# Patient Record
Sex: Female | Born: 1960
Health system: Southern US, Community
[De-identification: ages and names within clinical notes are randomized; demographics above are authoritative.]

## PROBLEM LIST (undated history)

## (undated) DIAGNOSIS — E785 Hyperlipidemia, unspecified: Secondary | ICD-10-CM

## (undated) DIAGNOSIS — R945 Abnormal results of liver function studies: Principal | ICD-10-CM

## (undated) DIAGNOSIS — I10 Essential (primary) hypertension: Secondary | ICD-10-CM

## (undated) DIAGNOSIS — M1990 Unspecified osteoarthritis, unspecified site: Secondary | ICD-10-CM

## (undated) DIAGNOSIS — E079 Disorder of thyroid, unspecified: Secondary | ICD-10-CM

## (undated) DIAGNOSIS — F419 Anxiety disorder, unspecified: Secondary | ICD-10-CM

## (undated) DIAGNOSIS — E039 Hypothyroidism, unspecified: Secondary | ICD-10-CM

## (undated) DIAGNOSIS — R7989 Other specified abnormal findings of blood chemistry: Secondary | ICD-10-CM

## (undated) HISTORY — DX: Anxiety disorder, unspecified: F41.9

## (undated) HISTORY — DX: Abnormal results of liver function studies: R94.5

## (undated) HISTORY — DX: Hypothyroidism, unspecified: E03.9

## (undated) HISTORY — DX: Hyperlipidemia, unspecified: E78.5

## (undated) HISTORY — DX: Disorder of thyroid, unspecified: E07.9

## (undated) HISTORY — DX: Other specified abnormal findings of blood chemistry: R79.89

## (undated) HISTORY — DX: Unspecified osteoarthritis, unspecified site: M19.90

## (undated) HISTORY — DX: Essential (primary) hypertension: I10

---
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
## 1979-03-22 HISTORY — PX: FUNCTIONAL ENDOSCOPIC SINUS SURGERY: SUR616

## 1999-01-26 ENCOUNTER — Encounter: Admission: RE | Admit: 1999-01-26 | Discharge: 1999-01-26 | Payer: Self-pay | Admitting: Obstetrics and Gynecology

## 1999-01-26 ENCOUNTER — Encounter: Payer: Self-pay | Admitting: Obstetrics and Gynecology

## 2009-07-28 LAB — HM MAMMOGRAPHY: HM Mammogram: NORMAL

## 2009-08-25 LAB — HM PAP SMEAR: HM Pap smear: NORMAL

## 2010-09-23 ENCOUNTER — Encounter: Payer: Self-pay | Admitting: Family Medicine

## 2010-09-23 DIAGNOSIS — E039 Hypothyroidism, unspecified: Secondary | ICD-10-CM | POA: Insufficient documentation

## 2011-08-27 ENCOUNTER — Other Ambulatory Visit: Payer: Self-pay

## 2011-08-27 ENCOUNTER — Encounter (HOSPITAL_COMMUNITY): Payer: Self-pay | Admitting: *Deleted

## 2011-08-27 ENCOUNTER — Emergency Department (HOSPITAL_COMMUNITY): Payer: Commercial Managed Care - PPO

## 2011-08-27 ENCOUNTER — Emergency Department (HOSPITAL_COMMUNITY)
Admission: EM | Admit: 2011-08-27 | Discharge: 2011-08-27 | Disposition: A | Discharge status: Home / Self Care | Payer: Commercial Managed Care - PPO | Attending: Emergency Medicine | Admitting: Emergency Medicine

## 2011-08-27 DIAGNOSIS — M549 Dorsalgia, unspecified: Secondary | ICD-10-CM | POA: Insufficient documentation

## 2011-08-27 DIAGNOSIS — E039 Hypothyroidism, unspecified: Secondary | ICD-10-CM | POA: Insufficient documentation

## 2011-08-27 DIAGNOSIS — R110 Nausea: Secondary | ICD-10-CM | POA: Insufficient documentation

## 2011-08-27 DIAGNOSIS — R079 Chest pain, unspecified: Secondary | ICD-10-CM | POA: Insufficient documentation

## 2011-08-27 DIAGNOSIS — R51 Headache: Secondary | ICD-10-CM | POA: Insufficient documentation

## 2011-08-27 LAB — CBC
HCT: 41.2 % (ref 36.0–46.0)
MCH: 29.4 pg (ref 26.0–34.0)
MCHC: 34 g/dL (ref 30.0–36.0)
MCV: 86.6 fL (ref 78.0–100.0)
RDW: 12.5 % (ref 11.5–15.5)
WBC: 5.9 10*3/uL (ref 4.0–10.5)

## 2011-08-27 LAB — DIFFERENTIAL
Basophils Absolute: 0.1 10*3/uL (ref 0.0–0.1)
Eosinophils Absolute: 0.4 10*3/uL (ref 0.0–0.7)
Eosinophils Relative: 7 % — ABNORMAL HIGH (ref 0–5)
Lymphocytes Relative: 29 % (ref 12–46)
Monocytes Absolute: 0.4 10*3/uL (ref 0.1–1.0)

## 2011-08-27 LAB — BASIC METABOLIC PANEL
CO2: 25 mEq/L (ref 19–32)
Calcium: 9.6 mg/dL (ref 8.4–10.5)
Creatinine, Ser: 0.74 mg/dL (ref 0.50–1.10)
GFR calc non Af Amer: >90 mL/min (ref >90)
Glucose, Bld: 96 mg/dL (ref 70–99)

## 2011-08-27 LAB — TROPONIN I: Troponin I: <0.3 ng/mL (ref <0.30)

## 2011-08-27 MED ORDER — KETOROLAC TROMETHAMINE 30 MG/ML IJ SOLN
30.0000 mg | Freq: Once | INTRAMUSCULAR | Status: AC
  Administered 2011-08-27: 30 mg via INTRAVENOUS
  Filled 2011-08-27: qty 1

## 2011-08-27 MED ORDER — SODIUM CHLORIDE 0.9 % IV BOLUS (SEPSIS)
1000.0000 mL | Freq: Once | INTRAVENOUS | Status: AC
  Administered 2011-08-27: 1000 mL via INTRAVENOUS

## 2011-08-27 MED ORDER — ONDANSETRON HCL 4 MG/2ML IJ SOLN
4.0000 mg | Freq: Once | INTRAMUSCULAR | Status: AC
  Administered 2011-08-27: 4 mg via INTRAVENOUS
  Filled 2011-08-27: qty 2

## 2011-08-27 NOTE — ED Provider Notes (Signed)
History     CSN: [id number]  Arrival date & time 08/27/11  1003   First MD Initiated Contact with Patient 08/27/11 1044      Chief Complaint  Patient presents with  . Chest Pain    (Consider location/radiation/quality/duration/timing/severity/associated sxs/prior treatment) HPI Comments: Patient presents complaining of chest tightness over the last 3-4 days.  She states that it goes down both arms and across her upper back.  She also notes a mild headache and mild nausea.  No vomiting.  No fevers or cough.  No significant shortness of breath.  No abdominal pain.  No new leg swelling or leg pain.  Patient does not smoke.  She does note a family history of coronary disease in her mother and father which makes her concerned for coronary disease.  No specific inciting or relieving factors.  Patient notes that she feels slightly more tired the last few days.  Patient is a 51 y.o. female presenting with chest pain. The history is provided by the patient. No language interpreter was used.  Chest Pain The chest pain began 3 - 5 days ago. Primary symptoms include nausea. Pertinent negatives for primary symptoms include no fever, no shortness of breath, no cough, no abdominal pain and no vomiting.     Past Medical History  Diagnosis Date  . Hypothyroidism   . Hypothyroidism     History reviewed. No pertinent past surgical history.  History reviewed. No pertinent family history.  History  Substance Use Topics  . Smoking status: Not on file  . Smokeless tobacco: Not on file  . Alcohol Use: 1.8 oz/week    1 Glasses of wine, 1 Cans of beer, 1 Shots of liquor per week    OB History    Grav Para Term Preterm Abortions TAB SAB Ect Mult Living                  Review of Systems  Constitutional: Negative.  Negative for fever and chills.  Eyes: Negative.  Negative for discharge and redness.  Respiratory: Positive for chest tightness. Negative for cough and shortness of breath.     Cardiovascular: Positive for chest pain.  Gastrointestinal: Positive for nausea. Negative for vomiting, abdominal pain and diarrhea.  Genitourinary: Negative.   Musculoskeletal: Positive for back pain.  Skin: Negative.  Negative for color change and rash.  Neurological: Positive for headaches. Negative for syncope.  Hematological: Negative.  Negative for adenopathy.  Psychiatric/Behavioral: Negative.  Negative for confusion.  All other systems reviewed and are negative.    Allergies  Betadine; Demerol; Fish allergy; and Shrimp  Home Medications   Current Outpatient Rx  Name Route Sig Dispense Refill  . ASPIRIN 81 MG PO CHEW Oral Chew 81 mg by mouth daily.    Marland Kitchen LEVOTHYROXINE SODIUM 88 MCG PO TABS Oral Take 88 mcg by mouth daily before breakfast.     . OVER THE COUNTER MEDICATION Sublingual Place 1 tablet under the tongue daily. Vitamin b 12 sublingual    . PHENTERMINE HCL 15 MG PO CAPS Oral Take 15 mg by mouth every morning.      BP 126/83  Pulse 63  Temp(Src) 98.3 F (36.8 C) (Oral)  Resp 14  SpO2 100%  Physical Exam  Nursing note and vitals reviewed. Constitutional: She is oriented to person, place, and time. She appears well-developed and well-nourished.  Non-toxic appearance. She does not have a sickly appearance.  HENT:  Head: Normocephalic and atraumatic.  Eyes: Conjunctivae, EOM and lids  are normal. Pupils are equal, round, and reactive to light. No scleral icterus.  Neck: Trachea normal and normal range of motion. Neck supple.  Cardiovascular: Normal rate, regular rhythm and normal heart sounds.   Pulmonary/Chest: Effort normal and breath sounds normal. No respiratory distress. She has no wheezes. She has no rales. She exhibits no tenderness.  Abdominal: Soft. Normal appearance. There is no tenderness. There is no rebound, no guarding and no CVA tenderness.  Musculoskeletal: Normal range of motion. She exhibits no edema.  Neurological: She is alert and oriented to  person, place, and time. She has normal strength.  Skin: Skin is warm, dry and intact. No rash noted.  Psychiatric: She has a normal mood and affect. Her behavior is normal. Judgment and thought content normal.    ED Course  Procedures (including critical care time)  Results for orders placed during the hospital encounter of 08/27/11  CBC      Component Value Range   WBC 5.9  4.0 - 10.5 (K/uL)   RBC 4.76  3.87 - 5.11 (MIL/uL)   Hemoglobin 14.0  12.0 - 15.0 (g/dL)   HCT 16.1  09.6 - 04.5 (%)   MCV 86.6  78.0 - 100.0 (fL)   MCH 29.4  26.0 - 34.0 (pg)   MCHC 34.0  30.0 - 36.0 (g/dL)   RDW 40.9  81.1 - 91.4 (%)   Platelets 270  150 - 400 (K/uL)  DIFFERENTIAL      Component Value Range   Neutrophils Relative 56  43 - 77 (%)   Neutro Abs 3.3  1.7 - 7.7 (K/uL)   Lymphocytes Relative 29  12 - 46 (%)   Lymphs Abs 1.7  0.7 - 4.0 (K/uL)   Monocytes Relative 7  3 - 12 (%)   Monocytes Absolute 0.4  0.1 - 1.0 (K/uL)   Eosinophils Relative 7 (*) 0 - 5 (%)   Eosinophils Absolute 0.4  0.0 - 0.7 (K/uL)   Basophils Relative 1  0 - 1 (%)   Basophils Absolute 0.1  0.0 - 0.1 (K/uL)  BASIC METABOLIC PANEL      Component Value Range   Sodium 137  135 - 145 (mEq/L)   Potassium 4.1  3.5 - 5.1 (mEq/L)   Chloride 102  96 - 112 (mEq/L)   CO2 25  19 - 32 (mEq/L)   Glucose, Bld 96  70 - 99 (mg/dL)   BUN 14  6 - 23 (mg/dL)   Creatinine, Ser 7.82  0.50 - 1.10 (mg/dL)   Calcium 9.6  8.4 - 95.6 (mg/dL)   GFR calc non Af Amer >90  >90 (mL/min)   GFR calc Af Amer >90  >90 (mL/min)  TROPONIN I      Component Value Range   Troponin I <0.30  <0.30 (ng/mL)   Dg Chest 2 View  08/27/2011  *RADIOLOGY REPORT*  Clinical Data: Chest pain.  CHEST - 2 VIEW  Comparison: None.  Findings: Two views of the chest demonstrate clear lungs. Heart and mediastinum are within normal limits.  The trachea is midline. Bony structures are intact.  IMPRESSION: No acute chest findings.  Original Report Authenticated By: Richarda Overlie,  M.D.      Date: 08/27/2011  Rate: 77  Rhythm: normal sinus rhythm  QRS Axis: normal  Intervals: normal  ST/T Wave abnormalities: normal  Conduction Disutrbances:none  Narrative Interpretation:   Old EKG Reviewed: none available    MDM  Patient with vague chest pain, nausea, shoulder pain  and headache over the last few days.  She has no acute symptoms for infection at this time.  This does not appear to be specifically exertional to suggest that this is coronary disease.  She has a TIMI 0 as her only risk factor is family history.  Patient has a normal EKG and as the symptoms have been going on for the last 3-4 days I believe 1 troponin is appropriate and it was negative.  Patient's chest x-ray is normal.  Her symptoms do not fit for PE as she does not have significant shortness of breath nor does she have tachycardia or any hypoxia or other risk factors for PE.  While the etiology of her symptoms are unclear I feel that she is safe for discharge home with followup with her primary care physician as an outpatient.        Nat Christen, MD 08/27/11 [phone]

## 2011-08-27 NOTE — ED Notes (Signed)
Pt. Stated, I started having some CP that started on Thursday with both arms hurting and going to my back.  Husband stated, her hot flashes have inreased.

## 2011-08-27 NOTE — Discharge Instructions (Signed)
Headache, General, Unknown Cause The specific cause of your headache may not have been found today. There are many causes and types of headache. A few common ones are:  Tension headache.   Migraine.   Infections (examples: dental and sinus infections).   Bone and/or joint problems in the neck or jaw.   Depression.   Eye problems.  These headaches are not life threatening.  Headaches can sometimes be diagnosed by a patient history and a physical exam. Sometimes, lab and imaging studies (such as x-ray and/or CT scan) are used to rule out more serious problems. In some cases, a spinal tap (lumbar puncture) may be requested. There are many times when your exam and tests may be normal on the first visit even when there is a serious problem causing your headaches. Because of that, it is very important to follow up with your doctor or local clinic for further evaluation. FINDING OUT THE RESULTS OF TESTS  If a radiology test was performed, a radiologist will review your results.   You will be contacted by the emergency department or your physician if any test results require a change in your treatment plan.   Not all test results may be available during your visit. If your test results are not back during the visit, make an appointment with your caregiver to find out the results. Do not assume everything is normal if you have not heard from your caregiver or the medical facility. It is important for you to follow up on all of your test results.  HOME CARE INSTRUCTIONS   Keep follow-up appointments with your caregiver, or any specialist referral.   Only take over-the-counter or prescription medicines for pain, discomfort, or fever as directed by your caregiver.   Biofeedback, massage, or other relaxation techniques may be helpful.   Ice packs or heat applied to the head and neck can be used. Do this three to four times per day, or as needed.   Call your doctor if you have any questions or  concerns.   If you smoke, you should quit.  SEEK MEDICAL CARE IF:   You develop problems with medications prescribed.   You do not respond to or obtain relief from medications.   You have a change from the usual headache.   You develop nausea or vomiting.  SEEK IMMEDIATE MEDICAL CARE IF:   If your headache becomes severe.   You have an unexplained oral temperature above 102 F (38.9 C), or as your caregiver suggests.   You have a stiff neck.   You have loss of vision.   You have muscular weakness.   You have loss of muscular control.   You develop severe symptoms different from your first symptoms.   You start losing your balance or have trouble walking.   You feel faint or pass out.  MAKE SURE YOU:   Understand these instructions.   Will watch your condition.   Will get help right away if you are not doing well or get worse.  Document Released: 03/07/2005 Document Revised: 02/24/2011 Document Reviewed: 10/25/2007 [facility] Patient Information 2012 [place], [place].Chest Pain (Nonspecific) It is often hard to give a specific diagnosis for the cause of chest pain. There is always a chance that your pain could be related to something serious, such as a heart attack or a blood clot in the lungs. You need to follow up with your caregiver for further evaluation. CAUSES   Heartburn.   Pneumonia or bronchitis.  Anxiety or stress.   Inflammation around your heart (pericarditis) or lung (pleuritis or pleurisy).   A blood clot in the lung.   A collapsed lung (pneumothorax). It can develop suddenly on its own (spontaneous pneumothorax) or from injury (trauma) to the chest.   Shingles infection (herpes zoster virus).  The chest wall is composed of bones, muscles, and cartilage. Any of these can be the source of the pain.  The bones can be bruised by injury.   The muscles or cartilage can be strained by coughing or overwork.   The cartilage can be affected by  inflammation and become sore (costochondritis).  DIAGNOSIS  Lab tests or other studies, such as X-rays, electrocardiography, stress testing, or cardiac imaging, may be needed to find the cause of your pain.  TREATMENT   Treatment depends on what may be causing your chest pain. Treatment may include:   Acid blockers for heartburn.   Anti-inflammatory medicine.   Pain medicine for inflammatory conditions.   Antibiotics if an infection is present.   You may be advised to change lifestyle habits. This includes stopping smoking and avoiding alcohol, caffeine, and chocolate.   You may be advised to keep your head raised (elevated) when sleeping. This reduces the chance of acid going backward from your stomach into your esophagus.   Most of the time, nonspecific chest pain will improve within 2 to 3 days with rest and mild pain medicine.  HOME CARE INSTRUCTIONS   If antibiotics were prescribed, take your antibiotics as directed. Finish them even if you start to feel better.   For the next few days, avoid physical activities that bring on chest pain. Continue physical activities as directed.   Do not smoke.   Avoid drinking alcohol.   Only take over-the-counter or prescription medicine for pain, discomfort, or fever as directed by your caregiver.   Follow your caregiver's suggestions for further testing if your chest pain does not go away.   Keep any follow-up appointments you made. If you do not go to an appointment, you could develop lasting (chronic) problems with pain. If there is any problem keeping an appointment, you must call to reschedule.  SEEK MEDICAL CARE IF:   You think you are having problems from the medicine you are taking. Read your medicine instructions carefully.   Your chest pain does not go away, even after treatment.   You develop a rash with blisters on your chest.  SEEK IMMEDIATE MEDICAL CARE IF:   You have increased chest pain or pain that spreads to your  arm, neck, jaw, back, or abdomen.   You develop shortness of breath, an increasing cough, or you are coughing up blood.   You have severe back or abdominal pain, feel nauseous, or vomit.   You develop severe weakness, fainting, or chills.   You have a fever.  THIS IS AN EMERGENCY. Do not wait to see if the pain will go away. Get medical help at once. Call your local emergency services (911 in [place] Do not drive yourself to the hospital. MAKE SURE YOU:   Understand these instructions.   Will watch your condition.   Will get help right away if you are not doing well or get worse.  Document Released: 12/15/2004 Document Revised: 02/24/2011 Document Reviewed: 10/11/2007 [facility] Patient Information 2012 [place], [place].

## 2012-06-06 ENCOUNTER — Telehealth: Payer: Self-pay | Admitting: Family Medicine

## 2012-06-06 MED ORDER — ALBENDAZOLE 200 MG PO TABS: 400.0000 mg | ORAL_TABLET | Freq: Once | ORAL | Status: DC

## 2012-06-06 NOTE — Telephone Encounter (Signed)
Pt called stating no better in a week after taking Albenza 400 mg x 1. Per [facility] use Balmex for itching qhs x 2 weeks and redose Albenza 400mg  x 1 dose if no better in 2 weeks NTBS. Med called in to [facility]. Patient aware.

## 2013-02-19 ENCOUNTER — Encounter: Payer: Self-pay | Admitting: Family Medicine

## 2013-02-25 ENCOUNTER — Encounter: Payer: Self-pay | Admitting: Family Medicine

## 2013-07-23 ENCOUNTER — Other Ambulatory Visit: Payer: Self-pay | Admitting: Family Medicine

## 2013-07-23 MED ORDER — LEVOTHYROXINE SODIUM 88 MCG PO TABS: 88.0000 ug | ORAL_TABLET | Freq: Every day | ORAL | Status: DC

## 2013-07-23 NOTE — Telephone Encounter (Signed)
Rx Refilled - pt has appt 07/26/13 and must keep to get further refills on medication

## 2013-07-24 ENCOUNTER — Other Ambulatory Visit: Payer: Self-pay | Admitting: Physician Assistant

## 2013-07-24 ENCOUNTER — Other Ambulatory Visit: Payer: BC Managed Care – PPO

## 2013-07-24 DIAGNOSIS — E669 Obesity, unspecified: Secondary | ICD-10-CM

## 2013-07-24 DIAGNOSIS — Z79899 Other long term (current) drug therapy: Secondary | ICD-10-CM

## 2013-07-24 DIAGNOSIS — E039 Hypothyroidism, unspecified: Secondary | ICD-10-CM

## 2013-07-24 DIAGNOSIS — E785 Hyperlipidemia, unspecified: Secondary | ICD-10-CM

## 2013-07-24 DIAGNOSIS — Z0000 Encounter for general adult medical examination without abnormal findings: Secondary | ICD-10-CM

## 2013-07-24 LAB — COMPLETE METABOLIC PANEL WITH GFR
ALK PHOS: 81 U/L (ref 39–117)
ALT: 67 U/L — ABNORMAL HIGH (ref 0–35)
AST: 55 U/L — ABNORMAL HIGH (ref 0–37)
Albumin: 4.4 g/dL (ref 3.5–5.2)
BILIRUBIN TOTAL: 0.5 mg/dL (ref 0.2–1.2)
BUN: 15 mg/dL (ref 6–23)
CO2: 27 meq/L (ref 19–32)
CREATININE: 0.86 mg/dL (ref 0.50–1.10)
Calcium: 9.5 mg/dL (ref 8.4–10.5)
Chloride: 105 mEq/L (ref 96–112)
GFR, EST AFRICAN AMERICAN: 89 mL/min
GFR, EST NON AFRICAN AMERICAN: 77 mL/min
GLUCOSE: 97 mg/dL (ref 70–99)
Potassium: 4.8 mEq/L (ref 3.5–5.3)
Sodium: 141 mEq/L (ref 135–145)
TOTAL PROTEIN: 6.5 g/dL (ref 6.0–8.3)

## 2013-07-24 LAB — CBC WITH DIFFERENTIAL/PLATELET
Basophils Absolute: 0.1 10*3/uL (ref 0.0–0.1)
Basophils Relative: 1 % (ref 0–1)
EOS ABS: 0.3 10*3/uL (ref 0.0–0.7)
Eosinophils Relative: 6 % — ABNORMAL HIGH (ref 0–5)
HCT: 40.5 % (ref 36.0–46.0)
HEMOGLOBIN: 13.5 g/dL (ref 12.0–15.0)
LYMPHS ABS: 1.6 10*3/uL (ref 0.7–4.0)
LYMPHS PCT: 31 % (ref 12–46)
MCH: 28.7 pg (ref 26.0–34.0)
MCHC: 33.3 g/dL (ref 30.0–36.0)
MCV: 86.2 fL (ref 78.0–100.0)
MONOS PCT: 7 % (ref 3–12)
Monocytes Absolute: 0.4 10*3/uL (ref 0.1–1.0)
NEUTROS ABS: 2.9 10*3/uL (ref 1.7–7.7)
NEUTROS PCT: 55 % (ref 43–77)
PLATELETS: 264 10*3/uL (ref 150–400)
RBC: 4.7 MIL/uL (ref 3.87–5.11)
RDW: 12.8 % (ref 11.5–15.5)
WBC: 5.2 10*3/uL (ref 4.0–10.5)

## 2013-07-24 LAB — LIPID PANEL
CHOL/HDL RATIO: 3.9 ratio
CHOLESTEROL: 226 mg/dL — AB (ref 0–200)
HDL: 58 mg/dL (ref >39)
LDL Cholesterol: 157 mg/dL — ABNORMAL HIGH (ref 0–99)
TRIGLYCERIDES: 54 mg/dL (ref <150)
VLDL: 11 mg/dL (ref 0–40)

## 2013-07-24 LAB — TSH: TSH: 3.913 u[IU]/mL (ref 0.350–4.500)

## 2013-07-26 ENCOUNTER — Other Ambulatory Visit: Payer: Self-pay | Admitting: Family Medicine

## 2013-07-26 ENCOUNTER — Encounter: Payer: Self-pay | Admitting: Family Medicine

## 2013-07-26 ENCOUNTER — Ambulatory Visit (INDEPENDENT_AMBULATORY_CARE_PROVIDER_SITE_OTHER): Payer: BC Managed Care – PPO | Admitting: Family Medicine

## 2013-07-26 ENCOUNTER — Other Ambulatory Visit: Payer: Self-pay | Admitting: *Deleted

## 2013-07-26 ENCOUNTER — Telehealth: Payer: Self-pay | Admitting: *Deleted

## 2013-07-26 VITALS — BP 122/88 | HR 84 | Temp 97.1°F | Resp 18 | Ht 61.5 in | Wt 151.0 lb

## 2013-07-26 DIAGNOSIS — Z0000 Encounter for general adult medical examination without abnormal findings: Secondary | ICD-10-CM

## 2013-07-26 DIAGNOSIS — E785 Hyperlipidemia, unspecified: Secondary | ICD-10-CM

## 2013-07-26 DIAGNOSIS — G4700 Insomnia, unspecified: Secondary | ICD-10-CM

## 2013-07-26 DIAGNOSIS — Z79899 Other long term (current) drug therapy: Secondary | ICD-10-CM

## 2013-07-26 LAB — HEPATITIS PANEL, ACUTE
HCV AB: NEGATIVE
Hep A IgM: NONREACTIVE
Hep B C IgM: NONREACTIVE
Hepatitis B Surface Ag: NEGATIVE

## 2013-07-26 MED ORDER — ESTROGENS, CONJUGATED 0.625 MG/GM VA CREA: 1.0000 | TOPICAL_CREAM | Freq: Every day | VAGINAL | Status: DC

## 2013-07-26 MED ORDER — TRAZODONE HCL 50 MG PO TABS: 25.0000 mg | ORAL_TABLET | Freq: Every evening | ORAL | Status: DC | PRN

## 2013-07-26 MED ORDER — PRAVASTATIN SODIUM 20 MG PO TABS: 20.0000 mg | ORAL_TABLET | Freq: Every day | ORAL | Status: DC

## 2013-07-26 NOTE — Telephone Encounter (Signed)
Call placed to patient and patient made aware.  

## 2013-07-26 NOTE — Telephone Encounter (Signed)
Lab was added to her other labs and I will send rx to her pharmacy.

## 2013-07-26 NOTE — Telephone Encounter (Signed)
Message copied by Phillips OdorSIX, Ivan Maskell H on Fri Jul 26, 2013  4:22 PM ------      Message from: Malvin JohnsBULLINS, SUSAN S      Created: Fri Jul 26, 2013  2:22 PM       Patient calling asking about her visit and she thought that there should of been some cream at the pharmacy there was none, and she said dr pickard was going to test her for hep c? She wanted to know if we did this [phone] ([phone])       ------

## 2013-07-26 NOTE — Progress Notes (Signed)
Subjective:    Patient ID: Meghan Lozano, female    DOB: 02/09/1961, 53 y.o.   MRN: [id number]  HPI  Patient is here today for complete physical exam.  She is also complaining of insomnia due to stress. She is also reporting fatigue. She has never had a colonoscopy. Her mammogram was performed in November of 2014. She is due for a Pap smear. Her most recent lab work his below: Lab on 07/24/2013  Component Date Value Ref Range Status  . WBC 07/24/2013 5.2  4.0 - 10.5 K/uL Final  . RBC 07/24/2013 4.70  3.87 - 5.11 MIL/uL Final  . Hemoglobin 07/24/2013 13.5  12.0 - 15.0 g/dL Final  . HCT 07/24/2013 40.5  36.0 - 46.0 % Final  . MCV 07/24/2013 86.2  78.0 - 100.0 fL Final  . MCH 07/24/2013 28.7  26.0 - 34.0 pg Final  . MCHC 07/24/2013 33.3  30.0 - 36.0 g/dL Final  . RDW 07/24/2013 12.8  11.5 - 15.5 % Final  . Platelets 07/24/2013 264  150 - 400 K/uL Final  . Neutrophils Relative % 07/24/2013 55  43 - 77 % Final  . Neutro Abs 07/24/2013 2.9  1.7 - 7.7 K/uL Final  . Lymphocytes Relative 07/24/2013 31  12 - 46 % Final  . Lymphs Abs 07/24/2013 1.6  0.7 - 4.0 K/uL Final  . Monocytes Relative 07/24/2013 7  3 - 12 % Final  . Monocytes Absolute 07/24/2013 0.4  0.1 - 1.0 K/uL Final  . Eosinophils Relative 07/24/2013 6* 0 - 5 % Final  . Eosinophils Absolute 07/24/2013 0.3  0.0 - 0.7 K/uL Final  . Basophils Relative 07/24/2013 1  0 - 1 % Final  . Basophils Absolute 07/24/2013 0.1  0.0 - 0.1 K/uL Final  . Smear Review 07/24/2013 Criteria for review not met   Final  . Cholesterol 07/24/2013 226* 0 - 200 mg/dL Final   Comment: ATP III Classification:                                < 200        mg/dL        Desirable                               200 - 239     mg/dL        Borderline High                               >= 240        mg/dL        High                             . Triglycerides 07/24/2013 54  <150 mg/dL Final  . HDL 07/24/2013 58  >39 mg/dL Final  . Total CHOL/HDL Ratio 07/24/2013  3.9   Final  . VLDL 07/24/2013 11  0 - 40 mg/dL Final  . LDL Cholesterol 07/24/2013 157* 0 - 99 mg/dL Final   Comment:                            Total Cholesterol/HDL Ratio:CHD Risk  Coronary Heart Disease Risk Table                                                                 Men       Women                                   1/2 Average Risk              3.4        3.3                                       Average Risk              5.0        4.4                                    2X Average Risk              9.6        7.1                                    3X Average Risk             23.4       11.0                          Use the calculated Patient Ratio above and the CHD Risk table                           to determine the patient's CHD Risk.                          ATP III Classification (LDL):                                < 100        mg/dL         Optimal                               100 - 129     mg/dL         Near or Above Optimal                               130 - 159     mg/dL         Borderline High                               160 - 189     mg/dL           High                                > 190        mg/dL         Very High                             . TSH 07/24/2013 3.913  0.350 - 4.500 uIU/mL Final  . Sodium 07/24/2013 141  135 - 145 mEq/L Final  . Potassium 07/24/2013 4.8  3.5 - 5.3 mEq/L Final  . Chloride 07/24/2013 105  96 - 112 mEq/L Final  . CO2 07/24/2013 27  19 - 32 mEq/L Final  . Glucose, Bld 07/24/2013 97  70 - 99 mg/dL Final  . BUN 07/24/2013 15  6 - 23 mg/dL Final  . Creat 07/24/2013 0.86  0.50 - 1.10 mg/dL Final  . Total Bilirubin 07/24/2013 0.5  0.2 - 1.2 mg/dL Final  . Alkaline Phosphatase 07/24/2013 81  39 - 117 U/L Final  . AST 07/24/2013 55* 0 - 37 U/L Final  . ALT 07/24/2013 67* 0 - 35 U/L Final  . Total Protein 07/24/2013 6.5  6.0 - 8.3 g/dL Final  . Albumin 07/24/2013 4.4  3.5 - 5.2 g/dL  Final  . Calcium 07/24/2013 9.5  8.4 - 10.5 mg/dL Final  . GFR, Est African American 07/24/2013 89   Final  . GFR, Est Non African American 07/24/2013 77   Final   Comment:                            The estimated GFR is a calculation valid for adults (>=62 years old)                          that uses the CKD-EPI algorithm to adjust for age and sex. It is                            not to be used for children, pregnant women, hospitalized patients,                             patients on dialysis, or with rapidly changing kidney function.                          According to the NKDEP, eGFR >89 is normal, 60-89 shows mild                          impairment, 30-59 shows moderate impairment, 15-29 shows severe                          impairment and <15 is ESRD.                              Past Medical History  Diagnosis Date  . Hypothyroidism   . Hypothyroidism    No past surgical history on file. Current Outpatient Prescriptions on File Prior to Visit  Medication Sig Dispense Refill  . levothyroxine (SYNTHROID, LEVOTHROID) 88 MCG tablet Take 1 tablet (88 mcg  total) by mouth daily before breakfast.  30 tablet  0   No current facility-administered medications on file prior to visit.   Allergies  Allergen Reactions  . Betadine [Povidone Iodine] Swelling  . Demerol Nausea And Vomiting  . Fish Allergy Nausea And Vomiting  . Shrimp [Shellfish Allergy] Nausea And Vomiting   History   Social History  . Marital Status: Married    Spouse Name: N/A    Number of Children: N/A  . Years of Education: N/A   Occupational History  . Not on file.   Social History Main Topics  . Smoking status: Never Smoker   . Smokeless tobacco: Never Used  . Alcohol Use: 1.8 oz/week    1 Glasses of wine, 1 Cans of beer, 1 Shots of liquor per week  . Drug Use: No  . Sexual Activity: Not on file     Comment: married to August Saucer,   Other Topics Concern  . Not on file   Social History Narrative  .  No narrative on file   Family History  Problem Relation Age of Onset  . Heart disease Mother   . Heart disease Father   . Heart disease Paternal Uncle      Review of Systems  All other systems reviewed and are negative.      Objective:   Physical Exam  Vitals reviewed. Constitutional: She is oriented to person, place, and time. She appears well-developed and well-nourished. No distress.  HENT:  Head: Normocephalic and atraumatic.  Right Ear: External ear normal.  Left Ear: External ear normal.  Nose: Nose normal.  Mouth/Throat: Oropharynx is clear and moist. No oropharyngeal exudate.  Eyes: Conjunctivae and EOM are normal. Pupils are equal, round, and reactive to light. Right eye exhibits no discharge. Left eye exhibits no discharge. No scleral icterus.  Neck: Normal range of motion. Neck supple. No JVD present. No tracheal deviation present. No thyromegaly present.  Cardiovascular: Normal rate, regular rhythm, normal heart sounds and intact distal pulses.  Exam reveals no gallop and no friction rub.   No murmur heard. Pulmonary/Chest: Effort normal and breath sounds normal. No stridor. No respiratory distress. She has no wheezes. She has no rales. She exhibits no tenderness.  Abdominal: Soft. Bowel sounds are normal. She exhibits no distension and no mass. There is no tenderness. There is no rebound and no guarding.  Genitourinary: Vagina normal and uterus normal.  Musculoskeletal: Normal range of motion. She exhibits no edema and no tenderness.  Lymphadenopathy:    She has no cervical adenopathy.  Neurological: She is alert and oriented to person, place, and time. She has normal reflexes. She displays normal reflexes. No cranial nerve deficit. She exhibits normal muscle tone. Coordination normal.  Skin: Skin is warm. No rash noted. She is not diaphoretic. No erythema. No pallor.  Psychiatric: She has a normal mood and affect. Her behavior is normal. Judgment and thought content  normal.          Assessment & Plan:  1. Routine general medical examination at a health care facility Exam is normal. Her immunizations are up to date. Have recommended diet exercise and weight loss. - PAP, Thin Prep w/HPV rflx HPV Type 16/18  2. Insomnia Begin trazodone 50 mg by mouth each bedtime when necessary insomnia. - traZODone (DESYREL) 50 MG tablet; Take 0.5-1 tablets (25-50 mg total) by mouth at bedtime as needed for sleep.  Dispense: 30 tablet; Refill: 3  3. HLD (hyperlipidemia) Start pravastatin 20 mg by  mouth daily. Recheck fasting lipid panel and CMP in 3 months. This is done mainly because of her strong family history for coronary artery disease. I also recommended aspirin 81 mg by mouth daily - pravastatin (PRAVACHOL) 20 MG tablet; Take 1 tablet (20 mg total) by mouth daily.  Dispense: 90 tablet; Refill: 3

## 2013-07-26 NOTE — Telephone Encounter (Signed)
Returned call to patient.   Reports that since her liver enzymes were elevated, MD had suggested to have Hep C test drawn. Patient is requesting to know if this lab was obtained or if she needs to come back to have it drawn.   Also states that MD suggested Estrogen cream for menopausal symptoms, but no new medications have been sent to pharmacy.   MD please advise.

## 2013-07-29 LAB — PAP, THIN PREP W/HPV RFLX HPV TYPE 16/18: HPV DNA HIGH RISK: NOT DETECTED

## 2013-08-16 ENCOUNTER — Other Ambulatory Visit: Payer: Self-pay | Admitting: Family Medicine

## 2013-11-29 ENCOUNTER — Other Ambulatory Visit: Payer: BC Managed Care – PPO

## 2013-11-29 DIAGNOSIS — E785 Hyperlipidemia, unspecified: Secondary | ICD-10-CM

## 2013-11-29 DIAGNOSIS — Z79899 Other long term (current) drug therapy: Secondary | ICD-10-CM

## 2013-11-29 LAB — COMPLETE METABOLIC PANEL WITH GFR
ALT: 61 U/L — ABNORMAL HIGH (ref 0–35)
AST: 37 U/L (ref 0–37)
Albumin: 4.5 g/dL (ref 3.5–5.2)
Alkaline Phosphatase: 78 U/L (ref 39–117)
BILIRUBIN TOTAL: 0.5 mg/dL (ref 0.2–1.2)
BUN: 14 mg/dL (ref 6–23)
CALCIUM: 9.5 mg/dL (ref 8.4–10.5)
CHLORIDE: 104 meq/L (ref 96–112)
CO2: 28 meq/L (ref 19–32)
CREATININE: 0.78 mg/dL (ref 0.50–1.10)
GFR, Est Non African American: 86 mL/min
Glucose, Bld: 87 mg/dL (ref 70–99)
Potassium: 4.8 mEq/L (ref 3.5–5.3)
Sodium: 139 mEq/L (ref 135–145)
Total Protein: 6.5 g/dL (ref 6.0–8.3)

## 2013-11-29 LAB — LIPID PANEL
CHOLESTEROL: 184 mg/dL (ref 0–200)
HDL: 59 mg/dL (ref >39)
LDL CALC: 114 mg/dL — AB (ref 0–99)
TRIGLYCERIDES: 55 mg/dL (ref <150)
Total CHOL/HDL Ratio: 3.1 Ratio
VLDL: 11 mg/dL (ref 0–40)

## 2013-12-10 ENCOUNTER — Other Ambulatory Visit: Payer: Self-pay | Admitting: *Deleted

## 2013-12-10 DIAGNOSIS — E039 Hypothyroidism, unspecified: Secondary | ICD-10-CM

## 2013-12-11 ENCOUNTER — Other Ambulatory Visit: Payer: BC Managed Care – PPO

## 2013-12-11 DIAGNOSIS — E039 Hypothyroidism, unspecified: Secondary | ICD-10-CM

## 2013-12-11 LAB — T4, FREE: FREE T4: 1.25 ng/dL (ref 0.80–1.80)

## 2013-12-11 LAB — TSH: TSH: 2.093 u[IU]/mL (ref 0.350–4.500)

## 2013-12-11 LAB — T3, FREE: T3 FREE: 3.2 pg/mL (ref 2.3–4.2)

## 2013-12-13 ENCOUNTER — Telehealth: Payer: Self-pay | Admitting: Family Medicine

## 2013-12-13 NOTE — Telephone Encounter (Signed)
Has been using the Trazodone prn for sleep since May.  It really is not working.  Can you recommend something else??

## 2013-12-16 MED ORDER — ZOLPIDEM TARTRATE 10 MG PO TABS: 10.0000 mg | ORAL_TABLET | Freq: Every evening | ORAL | Status: DC | PRN

## 2013-12-16 NOTE — Telephone Encounter (Signed)
Pt aware of new RX.  Do not take Trazadone any more.  Pt understands

## 2013-12-16 NOTE — Telephone Encounter (Signed)
rx fax to pharmacy 

## 2013-12-16 NOTE — Telephone Encounter (Signed)
Try ambien 10 mg poqhs prn insomnia (30).

## 2014-02-25 ENCOUNTER — Telehealth: Payer: Self-pay | Admitting: Family Medicine

## 2014-02-25 MED ORDER — ZOLPIDEM TARTRATE 10 MG PO TABS: 10.0000 mg | ORAL_TABLET | Freq: Every evening | ORAL | Status: DC | PRN

## 2014-02-25 NOTE — Telephone Encounter (Signed)
Pharmacy req refill Zolpidem 10 mg.  Last RF 10/28 #30 + 1.  LOV CPE 07/26/13  OK refill?

## 2014-02-25 NOTE — Telephone Encounter (Signed)
rx called in

## 2014-02-25 NOTE — Telephone Encounter (Signed)
ok 

## 2014-03-11 ENCOUNTER — Encounter: Payer: Self-pay | Admitting: Family Medicine

## 2014-03-15 ENCOUNTER — Other Ambulatory Visit: Payer: Self-pay | Admitting: Family Medicine

## 2014-03-16 NOTE — Telephone Encounter (Signed)
Refill appropriate and filled per protocol. 

## 2014-05-14 ENCOUNTER — Telehealth: Payer: Self-pay | Admitting: Family Medicine

## 2014-05-14 NOTE — Telephone Encounter (Signed)
Requesting a refill on Ambien - ? OK to Refill  

## 2014-05-15 MED ORDER — ZOLPIDEM TARTRATE 10 MG PO TABS: 10.0000 mg | ORAL_TABLET | Freq: Every evening | ORAL | Status: DC | PRN

## 2014-05-15 NOTE — Telephone Encounter (Signed)
Med called to pharm 

## 2014-05-15 NOTE — Telephone Encounter (Signed)
ok 

## 2014-07-03 ENCOUNTER — Telehealth: Payer: Self-pay | Admitting: Family Medicine

## 2014-07-03 NOTE — Telephone Encounter (Signed)
Patient is calling to ask some questions about when she needs to get bloodwork done please call her back at [phone]

## 2014-07-04 ENCOUNTER — Other Ambulatory Visit: Payer: Self-pay | Admitting: Family Medicine

## 2014-07-04 DIAGNOSIS — Z79899 Other long term (current) drug therapy: Secondary | ICD-10-CM

## 2014-07-04 DIAGNOSIS — E039 Hypothyroidism, unspecified: Secondary | ICD-10-CM

## 2014-07-04 DIAGNOSIS — E785 Hyperlipidemia, unspecified: Secondary | ICD-10-CM

## 2014-07-04 DIAGNOSIS — Z0000 Encounter for general adult medical examination without abnormal findings: Secondary | ICD-10-CM

## 2014-07-04 NOTE — Telephone Encounter (Signed)
Left pt message.  Has CPE appt for 07/28/14.  Come the week before fasting and have labs done.  Orders placed.

## 2014-07-12 ENCOUNTER — Other Ambulatory Visit: Payer: Self-pay | Admitting: Family Medicine

## 2014-07-14 NOTE — Telephone Encounter (Signed)
One refill to hold until up coming appt.

## 2014-07-16 ENCOUNTER — Encounter: Payer: Self-pay | Admitting: Physician Assistant

## 2014-07-16 ENCOUNTER — Ambulatory Visit (INDEPENDENT_AMBULATORY_CARE_PROVIDER_SITE_OTHER): Payer: BLUE CROSS/BLUE SHIELD | Admitting: Physician Assistant

## 2014-07-16 VITALS — BP 114/76 | HR 72 | Temp 98.4°F | Resp 18 | Wt 142.0 lb

## 2014-07-16 DIAGNOSIS — Z8614 Personal history of Methicillin resistant Staphylococcus aureus infection: Secondary | ICD-10-CM

## 2014-07-16 DIAGNOSIS — J3489 Other specified disorders of nose and nasal sinuses: Secondary | ICD-10-CM | POA: Diagnosis not present

## 2014-07-16 MED ORDER — SULFAMETHOXAZOLE-TRIMETHOPRIM 800-160 MG PO TABS: 1.0000 | ORAL_TABLET | Freq: Two times a day (BID) | ORAL | Status: DC

## 2014-07-16 NOTE — Progress Notes (Signed)
Patient ID: Meghan Lozano MRN: [id number], DOB: 1960/04/07, 54 y.o. Date of Encounter: 07/16/2014, 8:57 AM    Chief Complaint:  Chief Complaint  Patient presents with  . burning irritation in nose/mouth    hx MRSA infection     HPI: 54 y.o. year old white female since with above complaint.  States that this past Monday 07/14/14 she had a burning sensation in her nose as if she had been sprayed by pepper spray in her nose. Says that since then it has continued to burn some but not as bad as it was Monday. Says that she has not been having a lot of sneezing and has not been having to blow her nose much. Does not think that her symptoms would be coming from allergies and does not think she has irritated her nose from blowing a lot or anything so not sure what would be causing this.  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 Says that she felt similar symptoms at that time. Says that was the only thing she could think of as a reason for her to be having this burning sensation now. Says that this feels exactly the same as it felt back then.  Says that she had sinus surgery back in her 16s but no other history of issues regarding her nasal area.     Home Meds:   Outpatient Prescriptions Prior to Visit  Medication Sig Dispense Refill  . levothyroxine (SYNTHROID, LEVOTHROID) 88 MCG tablet TAKE 1 TABLET (88 MCG TOTAL) BY MOUTH DAILY BEFORE BREAKFAST. 30 tablet 0  . pravastatin (PRAVACHOL) 20 MG tablet Take 1 tablet (20 mg total) by mouth daily. 90 tablet 3  . zolpidem (AMBIEN) 10 MG tablet Take 1 tablet (10 mg total) by mouth at bedtime as needed for sleep. 30 tablet 1  . conjugated estrogens (PREMARIN) vaginal cream Place 1 Applicatorful vaginally daily. Call MD with results after 2 months. (Patient not taking: Reported on 07/16/2014) 42.5 g 1   No facility-administered medications prior to visit.    Allergies:  Allergies   Allergen Reactions  . Betadine [Povidone Iodine] Swelling  . Demerol Nausea And Vomiting  . Fish Allergy Nausea And Vomiting  . Shrimp [Shellfish Allergy] Nausea And Vomiting      Review of Systems: See HPI for pertinent ROS. All other ROS negative.    Physical Exam: Blood pressure 114/76, pulse 72, temperature 98.4 F (36.9 C), temperature source Oral, resp. rate 18, weight 142 lb (64.411 kg)., Body mass index is 26.4 kg/(m^2). General:  WNWD WF. Appears in no acute distress. HEENT: Normocephalic, atraumatic, eyes without discharge, sclera non-icteric, nares are without discharge. Exam of Nares: Left nares appears much more red/irritated than right nares. The entire mucosa on the left has diffuse erythema/irritation.  I see no distinct lesion--no papule, abscess, mass etc.  Right nares appears normal--mucosa is a lighter pink than on the left. I see no lesion, no mass etc. Neck: Supple. No thyromegaly. No lymphadenopathy. Lungs: Clear bilaterally to auscultation without wheezes, rales, or rhonchi. Breathing is unlabored. Heart: Regular rhythm. No murmurs, rubs, or gallops. Msk:  Strength and tone normal for age. Extremities/Skin: Warm and dry. Neuro: Alert and oriented X 3. Moves all extremities spontaneously. Gait is normal. CNII-XII grossly in tact. Psych:  Responds to questions appropriately with a normal affect.     ASSESSMENT AND PLAN:  54 y.o. year old female with  1. History of MRSA infection - Culture, routine-abscess - sulfamethoxazole-trimethoprim (BACTRIM DS,SEPTRA DS) 800-160 MG per tablet; Take 1 tablet by mouth 2 (two) times daily.  Dispense: 14 tablet; Refill: 0  2. Nasal pain - Culture, routine-abscess - sulfamethoxazole-trimethoprim (BACTRIM DS,SEPTRA DS) 800-160 MG per tablet; Take 1 tablet by mouth 2 (two) times daily.  Dispense: 14 tablet; Refill: 0  Go ahead and and start empiric antibiotics to cover for MRSA. Will send culture. Told her to start using  nasal saline routinely throughout the day to keep membranes moist. If symptoms worsen or do not resolve with completion of antibiotic, then call us and will consider referral to ENT if needed.   [address]igned, Mary Beth Hall [place], [place], [facility] 07/16/2014 8:57 AM

## 2014-07-20 LAB — CULTURE, ROUTINE-ABSCESS
Gram Stain: NONE SEEN
Gram Stain: NONE SEEN

## 2014-07-21 ENCOUNTER — Telehealth: Payer: Self-pay | Admitting: Family Medicine

## 2014-07-21 NOTE — Telephone Encounter (Signed)
MBD saw this patient and results are back but have not been read by provider.

## 2014-07-21 NOTE — Telephone Encounter (Signed)
Patient calling to get lab results  [phone]

## 2014-07-23 NOTE — Telephone Encounter (Signed)
Pt aware of results 

## 2014-07-25 ENCOUNTER — Telehealth: Payer: Self-pay | Admitting: Family Medicine

## 2014-07-25 ENCOUNTER — Other Ambulatory Visit: Payer: BLUE CROSS/BLUE SHIELD

## 2014-07-25 ENCOUNTER — Other Ambulatory Visit: Payer: Self-pay | Admitting: Family Medicine

## 2014-07-25 DIAGNOSIS — E785 Hyperlipidemia, unspecified: Secondary | ICD-10-CM

## 2014-07-25 DIAGNOSIS — Z0000 Encounter for general adult medical examination without abnormal findings: Secondary | ICD-10-CM

## 2014-07-25 DIAGNOSIS — E039 Hypothyroidism, unspecified: Secondary | ICD-10-CM

## 2014-07-25 DIAGNOSIS — Z79899 Other long term (current) drug therapy: Secondary | ICD-10-CM

## 2014-07-25 LAB — LIPID PANEL
CHOLESTEROL: 181 mg/dL (ref 0–200)
HDL: 57 mg/dL (ref >=46)
LDL Cholesterol: 111 mg/dL — ABNORMAL HIGH (ref 0–99)
Total CHOL/HDL Ratio: 3.2 Ratio
Triglycerides: 66 mg/dL (ref <150)
VLDL: 13 mg/dL (ref 0–40)

## 2014-07-25 LAB — CBC WITH DIFFERENTIAL/PLATELET
BASOS ABS: 0.1 10*3/uL (ref 0.0–0.1)
BASOS PCT: 1 % (ref 0–1)
EOS ABS: 0.3 10*3/uL (ref 0.0–0.7)
Eosinophils Relative: 5 % (ref 0–5)
HEMATOCRIT: 42 % (ref 36.0–46.0)
HEMOGLOBIN: 13.9 g/dL (ref 12.0–15.0)
Lymphocytes Relative: 28 % (ref 12–46)
Lymphs Abs: 1.5 10*3/uL (ref 0.7–4.0)
MCH: 29.1 pg (ref 26.0–34.0)
MCHC: 33.1 g/dL (ref 30.0–36.0)
MCV: 88.1 fL (ref 78.0–100.0)
MONO ABS: 0.4 10*3/uL (ref 0.1–1.0)
MONOS PCT: 7 % (ref 3–12)
MPV: 10 fL (ref 8.6–12.4)
Neutro Abs: 3.1 10*3/uL (ref 1.7–7.7)
Neutrophils Relative %: 59 % (ref 43–77)
Platelets: 291 10*3/uL (ref 150–400)
RBC: 4.77 MIL/uL (ref 3.87–5.11)
RDW: 13.2 % (ref 11.5–15.5)
WBC: 5.3 10*3/uL (ref 4.0–10.5)

## 2014-07-25 LAB — COMPLETE METABOLIC PANEL WITH GFR
ALBUMIN: 4.7 g/dL (ref 3.5–5.2)
ALT: 111 U/L — ABNORMAL HIGH (ref 0–35)
AST: 79 U/L — ABNORMAL HIGH (ref 0–37)
Alkaline Phosphatase: 72 U/L (ref 39–117)
BUN: 15 mg/dL (ref 6–23)
CO2: 25 meq/L (ref 19–32)
Calcium: 9.7 mg/dL (ref 8.4–10.5)
Chloride: 102 mEq/L (ref 96–112)
Creat: 0.85 mg/dL (ref 0.50–1.10)
GFR, EST AFRICAN AMERICAN: 89 mL/min
GFR, EST NON AFRICAN AMERICAN: 77 mL/min
GLUCOSE: 85 mg/dL (ref 70–99)
POTASSIUM: 4.6 meq/L (ref 3.5–5.3)
Sodium: 140 mEq/L (ref 135–145)
Total Bilirubin: 0.4 mg/dL (ref 0.2–1.2)
Total Protein: 7.2 g/dL (ref 6.0–8.3)

## 2014-07-25 LAB — TSH: TSH: 1.645 u[IU]/mL (ref 0.350–4.500)

## 2014-07-25 MED ORDER — ZOLPIDEM TARTRATE 10 MG PO TABS: 10.0000 mg | ORAL_TABLET | Freq: Every evening | ORAL | Status: DC | PRN

## 2014-07-25 NOTE — Telephone Encounter (Signed)
?   OK to Refill  

## 2014-07-25 NOTE — Telephone Encounter (Signed)
ok 

## 2014-07-25 NOTE — Telephone Encounter (Signed)
Medication called/sent to requested pharmacy  

## 2014-07-25 NOTE — Telephone Encounter (Signed)
Patient calling to ask for refill on her Remus Lofflerambien  [phone]

## 2014-07-28 ENCOUNTER — Ambulatory Visit (INDEPENDENT_AMBULATORY_CARE_PROVIDER_SITE_OTHER): Payer: BLUE CROSS/BLUE SHIELD | Admitting: Family Medicine

## 2014-07-28 ENCOUNTER — Encounter: Payer: Self-pay | Admitting: Family Medicine

## 2014-07-28 VITALS — BP 140/90 | HR 80 | Temp 98.7°F | Resp 16 | Ht 62.0 in | Wt 141.0 lb

## 2014-07-28 DIAGNOSIS — R945 Abnormal results of liver function studies: Principal | ICD-10-CM

## 2014-07-28 DIAGNOSIS — R7989 Other specified abnormal findings of blood chemistry: Secondary | ICD-10-CM

## 2014-07-28 DIAGNOSIS — Z0000 Encounter for general adult medical examination without abnormal findings: Secondary | ICD-10-CM | POA: Diagnosis not present

## 2014-07-28 NOTE — Progress Notes (Signed)
Subjective:    Patient ID: Meghan Lozano, female    DOB: [birth year], 54 y.o.   MRN: [id number]  HPI   patient is here today for complete physical exam. Her mammogram was performed earlier this year and was normal. Her Pap smear was performed last year and is normal. She has never had a colonoscopy. She is overdue for this. However the patient is very concerned because her liver function tests have increased significantly.  She's had some mild elevations in her liver function tests in the past. Will perform viral hepatitis serologies last year which were normal. I assume the patient had fatty liver disease and recommended taking pravastatin particularly given her strong family history of coronary artery disease. The patient agreed and has been taking pravastatin 20 mg a day. She is also been using significant amounts of Tylenol for headache. Patient also does drink  Alcohol although she is not a heavy daily drinker.   Appointment on 07/25/2014  Component Date Value Ref Range Status  . WBC 07/25/2014 5.3  4.0 - 10.5 K/uL Final  . RBC 07/25/2014 4.77  3.87 - 5.11 MIL/uL Final  . Hemoglobin 07/25/2014 13.9  12.0 - 15.0 g/dL Final  . HCT 07/25/2014 42.0  36.0 - 46.0 % Final  . MCV 07/25/2014 88.1  78.0 - 100.0 fL Final  . MCH 07/25/2014 29.1  26.0 - 34.0 pg Final  . MCHC 07/25/2014 33.1  30.0 - 36.0 g/dL Final  . RDW 07/25/2014 13.2  11.5 - 15.5 % Final  . Platelets 07/25/2014 291  150 - 400 K/uL Final  . MPV 07/25/2014 10.0  8.6 - 12.4 fL Final  . Neutrophils Relative % 07/25/2014 59  43 - 77 % Final  . Neutro Abs 07/25/2014 3.1  1.7 - 7.7 K/uL Final  . Lymphocytes Relative 07/25/2014 28  12 - 46 % Final  . Lymphs Abs 07/25/2014 1.5  0.7 - 4.0 K/uL Final  . Monocytes Relative 07/25/2014 7  3 - 12 % Final  . Monocytes Absolute 07/25/2014 0.4  0.1 - 1.0 K/uL Final  . Eosinophils Relative 07/25/2014 5  0 - 5 % Final  . Eosinophils Absolute 07/25/2014 0.3  0.0 - 0.7 K/uL Final  . Basophils  Relative 07/25/2014 1  0 - 1 % Final  . Basophils Absolute 07/25/2014 0.1  0.0 - 0.1 K/uL Final  . Smear Review 07/25/2014 Criteria for review not met   Final  . Sodium 07/25/2014 140  135 - 145 mEq/L Final  . Potassium 07/25/2014 4.6  3.5 - 5.3 mEq/L Final  . Chloride 07/25/2014 102  96 - 112 mEq/L Final  . CO2 07/25/2014 25  19 - 32 mEq/L Final  . Glucose, Bld 07/25/2014 85  70 - 99 mg/dL Final  . BUN 07/25/2014 15  6 - 23 mg/dL Final  . Creat 07/25/2014 0.85  0.50 - 1.10 mg/dL Final  . Total Bilirubin 07/25/2014 0.4  0.2 - 1.2 mg/dL Final  . Alkaline Phosphatase 07/25/2014 72  39 - 117 U/L Final  . AST 07/25/2014 79* 0 - 37 U/L Final  . ALT 07/25/2014 111* 0 - 35 U/L Final  . Total Protein 07/25/2014 7.2  6.0 - 8.3 g/dL Final  . Albumin 07/25/2014 4.7  3.5 - 5.2 g/dL Final  . Calcium 07/25/2014 9.7  8.4 - 10.5 mg/dL Final  . GFR, Est African American 07/25/2014 89   Final  . GFR, Est Non African American 07/25/2014 77   Final  Comment:   The estimated GFR is a calculation valid for adults (>=81 years old) that uses the CKD-EPI algorithm to adjust for age and sex. It is   not to be used for children, pregnant women, hospitalized patients,    patients on dialysis, or with rapidly changing kidney function. According to the NKDEP, eGFR >89 is normal, 60-89 shows mild impairment, 30-59 shows moderate impairment, 15-29 shows severe impairment and <15 is ESRD.     . TSH 07/25/2014 1.645  0.350 - 4.500 uIU/mL Final  . Cholesterol 07/25/2014 181  0 - 200 mg/dL Final   Comment: ATP III Classification:       < 200        mg/dL        Desirable      200 - 239     mg/dL        Borderline High      >= 240        mg/dL        High     . Triglycerides 07/25/2014 66  <150 mg/dL Final  . HDL 07/25/2014 57  >=46 mg/dL Final   ** Please note change in reference range(s). **  . Total CHOL/HDL Ratio 07/25/2014 3.2   Final  . VLDL 07/25/2014 13  0 - 40 mg/dL Final  . LDL Cholesterol 07/25/2014  111* 0 - 99 mg/dL Final   Comment:   Total Cholesterol/HDL Ratio:CHD Risk                        Coronary Heart Disease Risk Table                                        Men       Women          1/2 Average Risk              3.4        3.3              Average Risk              5.0        4.4           2X Average Risk              9.6        7.1           3X Average Risk             23.4       11.0 Use the calculated Patient Ratio above and the CHD Risk table  to determine the patient's CHD Risk. ATP III Classification (LDL):       < 100        mg/dL         Optimal      100 - 129     mg/dL         Near or Above Optimal      130 - 159     mg/dL         Borderline High      160 - 189     mg/dL         High       > 190        mg/dL  Very High     Office Visit on 07/16/2014  Component Date Value Ref Range Status  . Gram Stain 07/16/2014 No WBC Seen   Final  . Gram Stain 07/16/2014 No Squamous Epithelial Cells Seen   Final  . Gram Stain 07/16/2014 Rare Gram Positive Cocci In Pairs In Clusters   Final  . Organism ID, Bacteria 07/16/2014 Multiple Organisms Present,None Predominant   Final   Comment: No Staphylococcus aureus isolated NO GROUP A STREP (S. PYOGENES) ISOLATED    Past Medical History  Diagnosis Date  . Hypothyroidism   . Hypothyroidism   . Hyperlipidemia   . Elevated LFTs    No past surgical history on file. Current Outpatient Prescriptions on File Prior to Visit  Medication Sig Dispense Refill  . levothyroxine (SYNTHROID, LEVOTHROID) 88 MCG tablet TAKE 1 TABLET (88 MCG TOTAL) BY MOUTH DAILY BEFORE BREAKFAST. 30 tablet 0  . pravastatin (PRAVACHOL) 20 MG tablet Take 1 tablet (20 mg total) by mouth daily. 90 tablet 3  . zolpidem (AMBIEN) 10 MG tablet Take 1 tablet (10 mg total) by mouth at bedtime as needed for sleep. 30 tablet 2   No current facility-administered medications on file prior to visit.   Allergies  Allergen Reactions  . Betadine [Povidone  Iodine] Swelling  . Demerol Nausea And Vomiting  . Fish Allergy Nausea And Vomiting  . Shrimp [Shellfish Allergy] Nausea And Vomiting   History   Social History  . Marital Status: Married    Spouse Name: N/A  . Number of Children: N/A  . Years of Education: N/A   Occupational History  . Not on file.   Social History Main Topics  . Smoking status: Never Smoker   . Smokeless tobacco: Never Used  . Alcohol Use: 1.8 oz/week    1 Glasses of wine, 1 Cans of beer, 1 Shots of liquor per week  . Drug Use: No  . Sexual Activity: Not on file     Comment: married to Marlou Sa,   Other Topics Concern  . Not on file   Social History Narrative   Family History  Problem Relation Age of Onset  . Heart disease Mother   . Heart disease Father   . Heart disease Paternal Uncle      Review of Systems  All other systems reviewed and are negative.      Objective:   Physical Exam  Constitutional: She is oriented to person, place, and time. She appears well-developed and well-nourished. No distress.  HENT:  Head: Normocephalic and atraumatic.  Right Ear: External ear normal.  Left Ear: External ear normal.  Nose: Nose normal.  Mouth/Throat: Oropharynx is clear and moist. No oropharyngeal exudate.  Eyes: Conjunctivae and EOM are normal. Pupils are equal, round, and reactive to light. Right eye exhibits no discharge. Left eye exhibits no discharge. No scleral icterus.  Neck: Normal range of motion. Neck supple. No JVD present. No tracheal deviation present. No thyromegaly present.  Cardiovascular: Normal rate, regular rhythm, normal heart sounds and intact distal pulses.  Exam reveals no gallop and no friction rub.   No murmur heard. Pulmonary/Chest: Effort normal and breath sounds normal. No stridor. No respiratory distress. She has no wheezes. She has no rales. She exhibits no tenderness.  Abdominal: Soft. Bowel sounds are normal. She exhibits no distension and no mass. There is no  tenderness. There is no rebound and no guarding.  Musculoskeletal: Normal range of motion. She exhibits no edema or tenderness.  Lymphadenopathy:  She has no cervical adenopathy.  Neurological: She is alert and oriented to person, place, and time. She has normal reflexes. She displays normal reflexes. No cranial nerve deficit. She exhibits normal muscle tone. Coordination normal.  Skin: Skin is warm. No rash noted. She is not diaphoretic. No erythema. No pallor.  Psychiatric: She has a normal mood and affect. Her behavior is normal. Judgment and thought content normal.  Vitals reviewed.    Patient is tender to palpation in her right posterior flank just over her floating ribs. She's been tender in that area for approximatel 4 months without any specific cause.      Assessment & Plan:  Elevated LFTs - Plan: US Abdomen Complete  Routine general medical examination at a health care facility   cancer screening is up-to-date as far as mammogram and Pap smear. She is overdue for colonoscopy. Therefore I will schedule her to see GI for colonoscopy. Meanwhile I have asked the patient to discontinue all consumption of alcohol , Tylenol, and pravastatin and repeat a CMP in 2 weeks. Meanwhile I will schedule her for a right upper quadrant ultrasound to evaluate for any evidence of liver pathology. I suspect that the patient has fatty liver disease and it has been exacerbated by her overuse of Tylenol combined with a statin medication and possibly alcohol.

## 2014-07-31 ENCOUNTER — Ambulatory Visit
Admission: RE | Admit: 2014-07-31 | Discharge: 2014-07-31 | Disposition: A | Discharge status: Home / Self Care | Payer: BLUE CROSS/BLUE SHIELD | Source: Ambulatory Visit | Attending: Family Medicine | Admitting: Family Medicine

## 2014-07-31 DIAGNOSIS — R945 Abnormal results of liver function studies: Principal | ICD-10-CM

## 2014-07-31 DIAGNOSIS — R7989 Other specified abnormal findings of blood chemistry: Secondary | ICD-10-CM

## 2014-08-01 ENCOUNTER — Encounter: Payer: Self-pay | Admitting: Family Medicine

## 2014-08-08 ENCOUNTER — Other Ambulatory Visit: Payer: BLUE CROSS/BLUE SHIELD

## 2014-08-08 DIAGNOSIS — R7989 Other specified abnormal findings of blood chemistry: Secondary | ICD-10-CM

## 2014-08-08 DIAGNOSIS — R945 Abnormal results of liver function studies: Principal | ICD-10-CM

## 2014-08-08 LAB — COMPLETE METABOLIC PANEL WITH GFR
ALT: 46 U/L — ABNORMAL HIGH (ref 0–35)
AST: 30 U/L (ref 0–37)
Albumin: 4.4 g/dL (ref 3.5–5.2)
Alkaline Phosphatase: 75 U/L (ref 39–117)
BILIRUBIN TOTAL: 0.6 mg/dL (ref 0.2–1.2)
BUN: 13 mg/dL (ref 6–23)
CHLORIDE: 102 meq/L (ref 96–112)
CO2: 29 mEq/L (ref 19–32)
CREATININE: 0.77 mg/dL (ref 0.50–1.10)
Calcium: 9.5 mg/dL (ref 8.4–10.5)
GFR, EST NON AFRICAN AMERICAN: 87 mL/min
GLUCOSE: 79 mg/dL (ref 70–99)
Potassium: 4.1 mEq/L (ref 3.5–5.3)
Sodium: 139 mEq/L (ref 135–145)
Total Protein: 6.9 g/dL (ref 6.0–8.3)

## 2014-08-15 ENCOUNTER — Other Ambulatory Visit: Payer: Self-pay | Admitting: Family Medicine

## 2014-08-15 NOTE — Telephone Encounter (Signed)
Medication refilled per protocol. 

## 2014-11-07 ENCOUNTER — Encounter: Payer: Self-pay | Admitting: Family Medicine

## 2014-11-07 ENCOUNTER — Ambulatory Visit (INDEPENDENT_AMBULATORY_CARE_PROVIDER_SITE_OTHER): Payer: BLUE CROSS/BLUE SHIELD | Admitting: Family Medicine

## 2014-11-07 VITALS — BP 120/84 | HR 78 | Temp 98.6°F | Resp 16 | Ht 62.0 in | Wt 141.0 lb

## 2014-11-07 DIAGNOSIS — L900 Lichen sclerosus et atrophicus: Secondary | ICD-10-CM | POA: Diagnosis not present

## 2014-11-07 DIAGNOSIS — R7989 Other specified abnormal findings of blood chemistry: Secondary | ICD-10-CM

## 2014-11-07 DIAGNOSIS — R945 Abnormal results of liver function studies: Secondary | ICD-10-CM

## 2014-11-07 LAB — COMPLETE METABOLIC PANEL WITH GFR
ALK PHOS: 78 U/L (ref 33–130)
ALT: 28 U/L (ref 6–29)
AST: 23 U/L (ref 10–35)
Albumin: 4.6 g/dL (ref 3.6–5.1)
BUN: 13 mg/dL (ref 7–25)
CO2: 27 mmol/L (ref 20–31)
Calcium: 9.1 mg/dL (ref 8.6–10.4)
Chloride: 104 mmol/L (ref 98–110)
Creat: 0.71 mg/dL (ref 0.50–1.05)
Glucose, Bld: 85 mg/dL (ref 70–99)
Potassium: 3.9 mmol/L (ref 3.5–5.3)
Sodium: 140 mmol/L (ref 135–146)
TOTAL PROTEIN: 6.8 g/dL (ref 6.1–8.1)
Total Bilirubin: 0.4 mg/dL (ref 0.2–1.2)

## 2014-11-07 MED ORDER — CLOBETASOL PROPIONATE 0.05 % EX CREA: 1.0000 "application " | TOPICAL_CREAM | Freq: Two times a day (BID) | CUTANEOUS | Status: DC

## 2014-11-07 NOTE — Progress Notes (Signed)
   Subjective:    Patient ID: Meghan Lozano, female    DOB: 1960-10-24, 54 y.o.   MRN: [id number]  HPI Patient is a very pleasant 54 year old white female who presents with chronic itching. Symptoms began approximately 2-3 years ago. She developed almost daily. Rectal itching. However over the last month of the condition seems to spread to the vulva. On examination today there are erythematous plaques around the rectum with fine white striations. There is also a similar erythematous plaque on her left vulva with white striations there is also numerous excoriations.  Patient also requires lab work to monitor her liver function test Past Medical History  Diagnosis Date  . Hypothyroidism   . Hypothyroidism   . Hyperlipidemia   . Elevated LFTs    No past surgical history on file. Current Outpatient Prescriptions on File Prior to Visit  Medication Sig Dispense Refill  . BIOTIN PO Take by mouth.    . levothyroxine (SYNTHROID, LEVOTHROID) 88 MCG tablet TAKE 1 TABLET BY MOUTH DAILY BEFORE BREAKFAST. 30 tablet 5  . pravastatin (PRAVACHOL) 20 MG tablet Take 1 tablet (20 mg total) by mouth daily. 90 tablet 3  . zolpidem (AMBIEN) 10 MG tablet Take 1 tablet (10 mg total) by mouth at bedtime as needed for sleep. 30 tablet 2   No current facility-administered medications on file prior to visit.   Allergies  Allergen Reactions  . Betadine [Povidone Iodine] Swelling  . Demerol Nausea And Vomiting  . Fish Allergy Nausea And Vomiting  . Shrimp [Shellfish Allergy] Nausea And Vomiting   Social History   Social History  . Marital Status: Married    Spouse Name: N/A  . Number of Children: N/A  . Years of Education: N/A   Occupational History  . Not on file.   Social History Main Topics  . Smoking status: Never Smoker   . Smokeless tobacco: Never Used  . Alcohol Use: 1.8 oz/week    1 Glasses of wine, 1 Cans of beer, 1 Shots of liquor per week  . Drug Use: No  . Sexual Activity: Not on file       Comment: married to August Saucer,   Other Topics Concern  . Not on file   Social History Narrative      Review of Systems  All other systems reviewed and are negative.      Objective:   Physical Exam  Cardiovascular: Normal rate, regular rhythm and normal heart sounds.   Pulmonary/Chest: Effort normal and breath sounds normal.  Genitourinary:    There is rash on the right labia. There is rash on the left labia.  Skin: Rash noted. There is erythema.  Vitals reviewed.         Assessment & Plan:  Lichen sclerosus - Plan: clobetasol cream (TEMOVATE) 0.05 %  Elevated LFTs - Plan: COMPLETE METABOLIC PANEL WITH GFR  Given the location, the chronicity, the patient demographic, and the appearance, I believe the patient is dealing with lichen sclerosis. I will treat the patient with clobetasol cream 0.05% applied twice a day for 10-14 days.  I explained to the patient that the rash can return and likely will return over time but that if it is lichen sclerosis, it should subside initially on the stairway cream. If the rash does not subside, I would like to refer the patient to a dermatologist for a second opinion

## 2014-11-10 ENCOUNTER — Encounter: Payer: Self-pay | Admitting: Family Medicine

## 2014-11-26 ENCOUNTER — Telehealth: Payer: Self-pay | Admitting: Family Medicine

## 2014-11-26 NOTE — Telephone Encounter (Signed)
Requesting a refill on Ambien - ? OK to Refill  

## 2014-11-27 MED ORDER — ZOLPIDEM TARTRATE 10 MG PO TABS: 10.0000 mg | ORAL_TABLET | Freq: Every evening | ORAL | Status: DC | PRN

## 2014-11-27 NOTE — Telephone Encounter (Signed)
Medication called/sent to requested pharmacy  

## 2014-11-27 NOTE — Telephone Encounter (Signed)
ok 

## 2015-02-09 ENCOUNTER — Other Ambulatory Visit: Payer: Self-pay | Admitting: Family Medicine

## 2015-04-14 ENCOUNTER — Telehealth: Payer: Self-pay | Admitting: *Deleted

## 2015-04-14 MED ORDER — ZOLPIDEM TARTRATE 10 MG PO TABS: 10.0000 mg | ORAL_TABLET | Freq: Every evening | ORAL | Status: DC | PRN

## 2015-04-14 NOTE — Telephone Encounter (Signed)
ok 

## 2015-04-14 NOTE — Telephone Encounter (Signed)
Received fax requesting refill on Ambien.   Ok to refill??  Last office visit 11/07/2014.  Last refill 11/27/2014, #2 refills.

## 2015-04-14 NOTE — Telephone Encounter (Signed)
Medication called to pharmacy. 

## 2015-04-23 LAB — HM MAMMOGRAPHY: HM Mammogram: NEGATIVE

## 2015-05-01 ENCOUNTER — Encounter: Payer: Self-pay | Admitting: Family Medicine

## 2015-07-03 ENCOUNTER — Ambulatory Visit (INDEPENDENT_AMBULATORY_CARE_PROVIDER_SITE_OTHER): Payer: BLUE CROSS/BLUE SHIELD | Admitting: Family Medicine

## 2015-07-03 ENCOUNTER — Encounter: Payer: Self-pay | Admitting: Family Medicine

## 2015-07-03 VITALS — BP 122/80 | HR 78 | Temp 98.6°F | Resp 18 | Wt 144.0 lb

## 2015-07-03 DIAGNOSIS — R5383 Other fatigue: Secondary | ICD-10-CM

## 2015-07-03 DIAGNOSIS — R0989 Other specified symptoms and signs involving the circulatory and respiratory systems: Secondary | ICD-10-CM

## 2015-07-03 DIAGNOSIS — F458 Other somatoform disorders: Secondary | ICD-10-CM

## 2015-07-03 LAB — CBC WITH DIFFERENTIAL/PLATELET
BASOS ABS: 59 {cells}/uL (ref 0–200)
Basophils Relative: 1 %
EOS ABS: 118 {cells}/uL (ref 15–500)
Eosinophils Relative: 2 %
HEMATOCRIT: 42.9 % (ref 35.0–45.0)
HEMOGLOBIN: 14.2 g/dL (ref 12.0–15.0)
LYMPHS ABS: 1416 {cells}/uL (ref 850–3900)
Lymphocytes Relative: 24 %
MCH: 29.3 pg (ref 27.0–33.0)
MCHC: 33.1 g/dL (ref 32.0–36.0)
MCV: 88.5 fL (ref 80.0–100.0)
MONO ABS: 472 {cells}/uL (ref 200–950)
MPV: 9.8 fL (ref 7.5–12.5)
Monocytes Relative: 8 %
NEUTROS ABS: 3835 {cells}/uL (ref 1500–7800)
NEUTROS PCT: 65 %
Platelets: 310 10*3/uL (ref 140–400)
RBC: 4.85 MIL/uL (ref 3.80–5.10)
RDW: 12.4 % (ref 11.0–15.0)
WBC: 5.9 10*3/uL (ref 3.8–10.8)

## 2015-07-03 LAB — TSH: TSH: 2.53 m[IU]/L

## 2015-07-03 LAB — COMPLETE METABOLIC PANEL WITH GFR
ALT: 33 U/L — AB (ref 6–29)
AST: 27 U/L (ref 10–35)
Albumin: 4.7 g/dL (ref 3.6–5.1)
Alkaline Phosphatase: 78 U/L (ref 33–130)
BILIRUBIN TOTAL: 0.5 mg/dL (ref 0.2–1.2)
BUN: 17 mg/dL (ref 7–25)
CALCIUM: 9.7 mg/dL (ref 8.6–10.4)
CO2: 26 mmol/L (ref 20–31)
CREATININE: 0.77 mg/dL (ref 0.50–1.05)
Chloride: 103 mmol/L (ref 98–110)
GFR, EST NON AFRICAN AMERICAN: 87 mL/min (ref >=60)
Glucose, Bld: 96 mg/dL (ref 70–99)
Potassium: 4.7 mmol/L (ref 3.5–5.3)
Sodium: 140 mmol/L (ref 135–146)
TOTAL PROTEIN: 7.1 g/dL (ref 6.1–8.1)

## 2015-07-03 LAB — VITAMIN B12: VITAMIN B 12: 557 pg/mL (ref 200–1100)

## 2015-07-03 MED ORDER — ESOMEPRAZOLE MAGNESIUM 40 MG PO CPDR: 40.0000 mg | DELAYED_RELEASE_CAPSULE | Freq: Every day | ORAL | Status: DC

## 2015-07-03 NOTE — Progress Notes (Signed)
Subjective:    Patient ID: Meghan Lozano, female    DOB: 1960/04/19, 55 y.o.   MRN: [id number]  HPI  The patient is a very pleasant 55 year old white female who presents with 3-4 weeks of globus sensation in the upper throat above the epiglottis. On one occasion she became choked in her upper esophagus on a [place] sandwich. However the rest of the time she denies any difficulty swallowing liquids or solids. She does feel a swelling or lump in the upper throat above the thyroid gland. She denies any change in her voice. She does report some soreness in that area. There is no palpable lymphadenopathy in the throat. There is no palpable thyromegaly or thyroid nodules. There are no palpable masses. She does occasionally have reflux. She does report more frequent belching. However she also reports increased hair loss in an androgenic pattern and also worsening fatigue Past Medical History  Diagnosis Date  . Hypothyroidism   . Hypothyroidism   . Hyperlipidemia   . Elevated LFTs    No past surgical history on file. Current Outpatient Prescriptions on File Prior to Visit  Medication Sig Dispense Refill  . BIOTIN PO Take by mouth.    . clobetasol cream (TEMOVATE) 0.05 % Apply 1 application topically 2 (two) times daily. 45 g 0  . levothyroxine (SYNTHROID, LEVOTHROID) 88 MCG tablet TAKE 1 TABLET BY MOUTH DAILY BEFORE BREAKFAST. 30 tablet 5  . pravastatin (PRAVACHOL) 20 MG tablet Take 1 tablet (20 mg total) by mouth daily. 90 tablet 3  . zolpidem (AMBIEN) 10 MG tablet Take 1 tablet (10 mg total) by mouth at bedtime as needed for sleep. 30 tablet 2   No current facility-administered medications on file prior to visit.   Allergies  Allergen Reactions  . Betadine [Povidone Iodine] Swelling  . Demerol Nausea And Vomiting  . Fish Allergy Nausea And Vomiting  . Shrimp [Shellfish Allergy] Nausea And Vomiting   Social History   Social History  . Marital Status: Married    Spouse Name: N/A  .  Number of Children: N/A  . Years of Education: N/A   Occupational History  . Not on file.   Social History Main Topics  . Smoking status: Never Smoker   . Smokeless tobacco: Never Used  . Alcohol Use: 1.8 oz/week    1 Glasses of wine, 1 Cans of beer, 1 Shots of liquor per week  . Drug Use: No  . Sexual Activity: Not on file     Comment: married to August Saucerean,   Other Topics Concern  . Not on file   Social History Narrative      Review of Systems  All other systems reviewed and are negative.      Objective:   Physical Exam  Constitutional: She appears well-developed and well-nourished.  HENT:  Nose: Nose normal.  Mouth/Throat: Oropharynx is clear and moist. No oropharyngeal exudate.  Neck: Neck supple. No thyromegaly present.  Cardiovascular: Normal rate, regular rhythm and normal heart sounds.  Exam reveals no gallop and no friction rub.   No murmur heard. Pulmonary/Chest: Effort normal and breath sounds normal. No respiratory distress. She has no wheezes. She has no rales.  Abdominal: Soft. Bowel sounds are normal.  Lymphadenopathy:    She has no cervical adenopathy.  Skin: No rash noted.  Vitals reviewed.    No pelvic exam was performed on today's exam. The diagram is a hold over from a previous office visit which I am unable to remove  from the physical exam this time     Assessment & Plan:  Other fatigue - Plan: CBC with Differential/Platelet, COMPLETE METABOLIC PANEL WITH GFR, TSH, Vitamin B12  Globus sensation - Plan: esomeprazole (NEXIUM) 40 MG capsule, Ambulatory referral to ENT I believe the patient is having globus sensation secondary to gastroesophageal reflux. Begin Nexium 40 mg by mouth daily. I will consult ENT for laryngoscopy as the majority of her symptoms seem to be located above the epiglottis or in the upper throat. Given the fatigue I will also check a CBC, CMP, TSH, and vitamin B12. I have recommended Rogaine for women for the androgenic hair loss

## 2015-07-06 ENCOUNTER — Encounter: Payer: Self-pay | Admitting: Family Medicine

## 2015-07-15 ENCOUNTER — Telehealth: Payer: Self-pay | Admitting: Family Medicine

## 2015-07-15 NOTE — Telephone Encounter (Signed)
Requesting a refill on Ambien - Ok to refill??       

## 2015-07-16 MED ORDER — ZOLPIDEM TARTRATE 10 MG PO TABS: 10.0000 mg | ORAL_TABLET | Freq: Every evening | ORAL | Status: DC | PRN

## 2015-07-16 NOTE — Telephone Encounter (Signed)
Medication called/sent to requested pharmacy  

## 2015-07-16 NOTE — Telephone Encounter (Signed)
ok 

## 2015-07-20 ENCOUNTER — Other Ambulatory Visit: Payer: Self-pay | Admitting: Family Medicine

## 2015-08-28 ENCOUNTER — Other Ambulatory Visit: Payer: Self-pay | Admitting: Family Medicine

## 2015-09-23 ENCOUNTER — Encounter: Payer: Self-pay | Admitting: Family Medicine

## 2015-09-23 ENCOUNTER — Ambulatory Visit (INDEPENDENT_AMBULATORY_CARE_PROVIDER_SITE_OTHER): Payer: BLUE CROSS/BLUE SHIELD | Admitting: Family Medicine

## 2015-09-23 VITALS — BP 126/60 | HR 80 | Temp 98.8°F | Resp 14 | Ht 62.0 in | Wt 145.0 lb

## 2015-09-23 DIAGNOSIS — K219 Gastro-esophageal reflux disease without esophagitis: Secondary | ICD-10-CM

## 2015-09-23 DIAGNOSIS — T148 Other injury of unspecified body region: Secondary | ICD-10-CM | POA: Diagnosis not present

## 2015-09-23 DIAGNOSIS — W57XXXA Bitten or stung by nonvenomous insect and other nonvenomous arthropods, initial encounter: Secondary | ICD-10-CM | POA: Diagnosis not present

## 2015-09-23 MED ORDER — DOXYCYCLINE HYCLATE 100 MG PO TABS: 100.0000 mg | ORAL_TABLET | Freq: Two times a day (BID) | ORAL | Status: DC

## 2015-09-23 MED ORDER — DEXLANSOPRAZOLE 60 MG PO CPDR: 60.0000 mg | DELAYED_RELEASE_CAPSULE | Freq: Every day | ORAL | Status: DC

## 2015-09-23 NOTE — Assessment & Plan Note (Signed)
Globus sensation also be secondary to acid reflux. I will try her on Dexilant and see if this works better than the Nexium at this does not help I will refer her to GI

## 2015-09-23 NOTE — Patient Instructions (Signed)
Start antibiotics as prescribed Try the dexilant as prescribed F/U pending results

## 2015-09-23 NOTE — Progress Notes (Signed)
Patient ID: Meghan Markeramela G Lozano, female   DOB: May 24, 1960, 55 y.o.   MRN: [id number]   Subjective:    Patient ID: Meghan Lozano, female    DOB: May 24, 1960, 55 y.o.   MRN: [id number]  Patient presents for Tick Bite Patient with concern for tickborne illness. She pulled a tick off for her couple weeks ago she still noticed a rash that pop up that resembles more a few small bites in a row she was doing fine and still this past week where she noticed some fatigue some nausea and neck stiffness she's not had any fever and no actual emesis. No joint swelling. She became concerned and came in for evaluation. Next  She has known history of globus sensation she was seen by ear nose and throat she did have laryngoscopic done in the office which showed signs of reflux she was told to continue Nexium and if this did not improve then she should be seen by gastroenterology. She has seen some mild improvement but still has a sensation in her throat. She does have history of hypothyroidism and is currently on Synthroid she has not had an ultrasound of the thyroid for any particular reason.    Review Of Systems:  GEN- denies fatigue, fever, weight loss,weakness, recent illness HEENT- denies eye drainage, change in vision, nasal discharge, CVS- denies chest pain, palpitations RESP- denies SOB, cough, wheeze ABD- denies N/V, change in stools, abd pain GU- denies dysuria, hematuria, dribbling, incontinence MSK- denies joint pain, muscle aches, injury Neuro- denies headache, dizziness, syncope, seizure activity       Objective:    BP 126/60 mmHg  Pulse 80  Temp(Src) 98.8 F (37.1 C) (Oral)  Resp 14  Ht 5\' 2"  (1.575 m)  Wt 145 lb (65.772 kg)  BMI 26.51 kg/m2 GEN- NAD, alert and oriented x3 HEENT- PERRL, EOMI, non injected sclera, pink conjunctiva, MMM, oropharynx clear Neck- Supple, no thyromegaly, no LAD , FROM , neg kernigs  CVS- RRR, no murmur RESP-CTAB ABD-NABS,soft,NT,ND Skin- in tact except-  lower back 4 small erythematous maculopapular lesions in semicircular apperance, no fluctuance, NT MSK- FROM upper and LE EXT- No edema Pulses- Radial 2+        Assessment & Plan:      Problem List Items Addressed This Visit    GERD (gastroesophageal reflux disease)    Globus sensation also be secondary to acid reflux. I will try her on Dexilant and see if this works better than the Nexium at this does not help I will refer her to GI      Relevant Medications   dexlansoprazole (DEXILANT) 60 MG capsule    Other Visit Diagnoses    Tick bite    -  Primary    interesting rash now with some systemic symptoms, check lyme titers, CBC, start doxycyline    Relevant Orders    CBC with Differential/Platelet    Comprehensive metabolic panel    B. [facility]dorfi Antibodies       Note: This dictation was prepared with Dragon dictation along with smaller phrase technology. Any transcriptional errors that result from this process are unintentional.

## 2015-09-24 LAB — COMPREHENSIVE METABOLIC PANEL
ALK PHOS: 66 U/L (ref 33–130)
ALT: 36 U/L — AB (ref 6–29)
AST: 29 U/L (ref 10–35)
Albumin: 4.6 g/dL (ref 3.6–5.1)
BILIRUBIN TOTAL: 0.3 mg/dL (ref 0.2–1.2)
BUN: 16 mg/dL (ref 7–25)
CO2: 27 mmol/L (ref 20–31)
Calcium: 9.3 mg/dL (ref 8.6–10.4)
Chloride: 103 mmol/L (ref 98–110)
Creat: 0.78 mg/dL (ref 0.50–1.05)
GLUCOSE: 86 mg/dL (ref 70–99)
POTASSIUM: 4.2 mmol/L (ref 3.5–5.3)
Sodium: 143 mmol/L (ref 135–146)
Total Protein: 6.6 g/dL (ref 6.1–8.1)

## 2015-09-24 LAB — LYME AB/WESTERN BLOT REFLEX: B burgdorferi Ab IgG+IgM: <0.9 Index (ref <0.90)

## 2015-09-24 LAB — CBC WITH DIFFERENTIAL/PLATELET
BASOS PCT: 1 %
Basophils Absolute: 78 cells/uL (ref 0–200)
EOS PCT: 3 %
Eosinophils Absolute: 234 cells/uL (ref 15–500)
HCT: 41.2 % (ref 35.0–45.0)
Hemoglobin: 13.6 g/dL (ref 12.0–15.0)
LYMPHS PCT: 19 %
Lymphs Abs: 1482 cells/uL (ref 850–3900)
MCH: 29 pg (ref 27.0–33.0)
MCHC: 33 g/dL (ref 32.0–36.0)
MCV: 87.8 fL (ref 80.0–100.0)
MONOS PCT: 9 %
MPV: 10.5 fL (ref 7.5–12.5)
Monocytes Absolute: 702 cells/uL (ref 200–950)
Neutro Abs: 5304 cells/uL (ref 1500–7800)
Neutrophils Relative %: 68 %
PLATELETS: 309 10*3/uL (ref 140–400)
RBC: 4.69 MIL/uL (ref 3.80–5.10)
RDW: 12.8 % (ref 11.0–15.0)
WBC: 7.8 10*3/uL (ref 3.8–10.8)

## 2015-11-07 ENCOUNTER — Other Ambulatory Visit: Payer: Self-pay | Admitting: Family Medicine

## 2015-11-09 NOTE — Telephone Encounter (Signed)
LRF 07/16/15 #30 + 2   LOV 09/23/15  OK refill?

## 2015-11-09 NOTE — Telephone Encounter (Signed)
rx called in

## 2015-11-09 NOTE — Telephone Encounter (Signed)
ok 

## 2016-02-13 ENCOUNTER — Other Ambulatory Visit: Payer: Self-pay | Admitting: Family Medicine

## 2016-02-15 NOTE — Telephone Encounter (Signed)
ok 

## 2016-02-15 NOTE — Telephone Encounter (Signed)
Ok to refill 

## 2016-04-19 ENCOUNTER — Other Ambulatory Visit: Payer: Self-pay | Admitting: Family Medicine

## 2016-04-19 NOTE — Telephone Encounter (Signed)
Rx filled per protocol  

## 2016-05-04 LAB — HM MAMMOGRAPHY

## 2016-05-11 ENCOUNTER — Encounter: Payer: Self-pay | Admitting: Family Medicine

## 2016-05-19 ENCOUNTER — Telehealth: Payer: Self-pay | Admitting: *Deleted

## 2016-05-19 MED ORDER — ZOLPIDEM TARTRATE 10 MG PO TABS: 10.0000 mg | ORAL_TABLET | Freq: Every evening | ORAL | 2 refills | Status: DC | PRN

## 2016-05-19 NOTE — Telephone Encounter (Signed)
Received fax requesting refill on Ambien.   Ok to refill??  Last office visit 09/23/2015  Last refill 02/15/2016, #2 refills.

## 2016-05-19 NOTE — Telephone Encounter (Signed)
Medication called to pharmacy. 

## 2016-05-19 NOTE — Telephone Encounter (Signed)
ok 

## 2016-06-16 ENCOUNTER — Encounter: Payer: Self-pay | Admitting: Family Medicine

## 2016-07-08 ENCOUNTER — Encounter: Payer: Self-pay | Admitting: Family Medicine

## 2016-07-08 ENCOUNTER — Ambulatory Visit (INDEPENDENT_AMBULATORY_CARE_PROVIDER_SITE_OTHER): Payer: 59 | Admitting: Family Medicine

## 2016-07-08 ENCOUNTER — Other Ambulatory Visit: Payer: Self-pay | Admitting: Family Medicine

## 2016-07-08 VITALS — BP 144/96 | HR 68 | Temp 97.7°F | Resp 14 | Ht 62.0 in | Wt 141.0 lb

## 2016-07-08 DIAGNOSIS — E049 Nontoxic goiter, unspecified: Secondary | ICD-10-CM | POA: Diagnosis not present

## 2016-07-08 DIAGNOSIS — F458 Other somatoform disorders: Secondary | ICD-10-CM

## 2016-07-08 DIAGNOSIS — R5382 Chronic fatigue, unspecified: Secondary | ICD-10-CM | POA: Diagnosis not present

## 2016-07-08 DIAGNOSIS — R0989 Other specified symptoms and signs involving the circulatory and respiratory systems: Secondary | ICD-10-CM

## 2016-07-08 LAB — COMPLETE METABOLIC PANEL WITH GFR
ALT: 23 U/L (ref 6–29)
AST: 21 U/L (ref 10–35)
Albumin: 4.5 g/dL (ref 3.6–5.1)
Alkaline Phosphatase: 71 U/L (ref 33–130)
BILIRUBIN TOTAL: 0.5 mg/dL (ref 0.2–1.2)
BUN: 19 mg/dL (ref 7–25)
CHLORIDE: 105 mmol/L (ref 98–110)
CO2: 26 mmol/L (ref 20–31)
Calcium: 9.6 mg/dL (ref 8.6–10.4)
Creat: 0.82 mg/dL (ref 0.50–1.05)
GFR, Est African American: >89 mL/min (ref >=60)
GFR, Est Non African American: 80 mL/min (ref >=60)
GLUCOSE: 93 mg/dL (ref 70–99)
POTASSIUM: 4.3 mmol/L (ref 3.5–5.3)
SODIUM: 140 mmol/L (ref 135–146)
Total Protein: 6.7 g/dL (ref 6.1–8.1)

## 2016-07-08 LAB — CBC WITH DIFFERENTIAL/PLATELET
Basophils Absolute: 59 cells/uL (ref 0–200)
Basophils Relative: 1 %
EOS PCT: 3 %
Eosinophils Absolute: 177 cells/uL (ref 15–500)
HCT: 43 % (ref 35.0–45.0)
Hemoglobin: 14 g/dL (ref 12.0–15.0)
LYMPHS ABS: 1475 {cells}/uL (ref 850–3900)
LYMPHS PCT: 25 %
MCH: 28.7 pg (ref 27.0–33.0)
MCHC: 32.6 g/dL (ref 32.0–36.0)
MCV: 88.3 fL (ref 80.0–100.0)
MPV: 10.2 fL (ref 7.5–12.5)
Monocytes Absolute: 472 cells/uL (ref 200–950)
Monocytes Relative: 8 %
NEUTROS PCT: 63 %
Neutro Abs: 3717 cells/uL (ref 1500–7800)
PLATELETS: 270 10*3/uL (ref 140–400)
RBC: 4.87 MIL/uL (ref 3.80–5.10)
RDW: 13 % (ref 11.0–15.0)
WBC: 5.9 10*3/uL (ref 3.8–10.8)

## 2016-07-08 NOTE — Progress Notes (Signed)
Subjective:    Patient ID: Meghan Lozano, female    DOB: [birth year], 56 y.o.   MRN: [id number]  HPI  07/03/15 The patient is a very pleasant 56 year old white female who presents with 3-4 weeks of globus sensation in the upper throat above the epiglottis. On one occasion she became choked in her upper esophagus on a [place] sandwich. However the rest of the time she denies any difficulty swallowing liquids or solids. She does feel a swelling or lump in the upper throat above the thyroid gland. She denies any change in her voice. She does report some soreness in that area. There is no palpable lymphadenopathy in the throat. There is no palpable thyromegaly or thyroid nodules. There are no palpable masses. She does occasionally have reflux. She does report more frequent belching. However she also reports increased hair loss in an androgenic pattern and also worsening fatigue. AT that time, my plan was: I believe the patient is having globus sensation secondary to gastroesophageal reflux. Begin Nexium 40 mg by mouth daily. I will consult ENT for laryngoscopy as the majority of her symptoms seem to be located above the epiglottis or in the upper throat. Given the fatigue I will also check a CBC, CMP, TSH, and vitamin B12. I have recommended Rogaine for women for the androgenic hair loss  07/08/16 Patient saw ENT performed direct laryngoscopy and only found inflammation secondary to reflux. She was started on a proton pump inhibitor. She states that she took the medication for several months. It only made symptoms worse today for reevaluation. Her biggest concern is a persistent lump in her throat. She also reports difficulty swallowing at times. At times she reports food sticking however the sensation is in her throat near the thyroid gland. She did stridor or difficulty breathing. The sensation is constant. There are no alleviating factors. There are no exacerbating factors. On examination today she does  have a small goiter. There are no palpable nodules. There is no lymphadenopathy. Examination the posterior oropharynx is normal. She also reports worsening fatigue. She states that she has no energy. She denies any chest pain or shortness of breath or dyspnea on exertion. She has been dealing with more stress. She denies any depression. She does have some anxiety. Past Medical History:  Diagnosis Date  . Elevated LFTs   . Hyperlipidemia   . Hypothyroidism   . Hypothyroidism    No past surgical history on file. Current Outpatient Prescriptions on File Prior to Visit  Medication Sig Dispense Refill  . clobetasol cream (TEMOVATE) 7.59 % APPLY 1 APPLICATION TOPICALLY 2 TIMES DAILY. 45 g 2  . SYNTHROID 88 MCG tablet TAKE 1 TABLET BY MOUTH DAILY BEFORE BREAKFAST. 30 tablet 7  . zolpidem (AMBIEN) 10 MG tablet Take 1 tablet (10 mg total) by mouth at bedtime as needed. for sleep 30 tablet 2   No current facility-administered medications on file prior to visit.    Allergies  Allergen Reactions  . Betadine [Povidone Iodine] Swelling  . Demerol Nausea And Vomiting  . Fish Allergy Nausea And Vomiting  . Shrimp [Shellfish Allergy] Nausea And Vomiting   Social History   Social History  . Marital status: Married    Spouse name: N/A  . Number of children: N/A  . Years of education: N/A   Occupational History  . Not on file.   Social History Main Topics  . Smoking status: Never Smoker  . Smokeless tobacco: Never Used  . Alcohol use 1.8  oz/week    1 Glasses of wine, 1 Cans of beer, 1 Shots of liquor per week  . Drug use: No  . Sexual activity: Not on file     Comment: married to Marlou Sa,   Other Topics Concern  . Not on file   Social History Narrative  . No narrative on file      Review of Systems  All other systems reviewed and are negative.      Objective:   Physical Exam  Constitutional: She appears well-developed and well-nourished. No distress.  HENT:  Nose: Nose normal.    Mouth/Throat: Oropharynx is clear and moist. No oropharyngeal exudate.  Eyes: Conjunctivae are normal. Pupils are equal, round, and reactive to light. No scleral icterus.  Neck: Neck supple. No JVD present. No tracheal deviation present. Thyromegaly present.  Cardiovascular: Normal rate, regular rhythm and normal heart sounds.  Exam reveals no gallop and no friction rub.   No murmur heard. Pulmonary/Chest: Effort normal and breath sounds normal. No stridor. No respiratory distress. She has no wheezes. She has no rales.  Abdominal: Soft. Bowel sounds are normal. She exhibits no distension and no mass. There is no tenderness. There is no rebound and no guarding.  Musculoskeletal: She exhibits no edema.  Lymphadenopathy:    She has no cervical adenopathy.  Skin: No rash noted. She is not diaphoretic.  Vitals reviewed.        Assessment & Plan:  Chronic fatigue - Plan: CBC with Differential/Platelet, COMPLETE METABOLIC PANEL WITH GFR, TSH, Sedimentation rate, Vitamin P80, CANCELED: Follicle stimulating hormone  Globus sensation - Plan: US Soft Tissue Head/Neck  Goiter - Plan: US Soft Tissue Head/Neck Her exam is reassuring.  However, she has a small tender goiter.  I will check an US of the neck.  Also check cbc, cmp, TSH, Vit B12, and ESR.

## 2016-07-09 LAB — TSH: TSH: 4.89 m[IU]/L — AB

## 2016-07-09 LAB — VITAMIN B12: VITAMIN B 12: 397 pg/mL (ref 200–1100)

## 2016-07-09 LAB — SEDIMENTATION RATE: SED RATE: 4 mm/h (ref 0–30)

## 2016-07-11 ENCOUNTER — Encounter: Payer: Self-pay | Admitting: Family Medicine

## 2016-07-11 LAB — LIPID PANEL
CHOL/HDL RATIO: 3.4 ratio (ref <5.0)
Cholesterol: 227 mg/dL — ABNORMAL HIGH (ref <200)
HDL: 67 mg/dL (ref >50)
LDL Cholesterol: 147 mg/dL — ABNORMAL HIGH (ref <100)
Triglycerides: 67 mg/dL (ref <150)
VLDL: 13 mg/dL (ref <30)

## 2016-07-12 ENCOUNTER — Ambulatory Visit
Admission: RE | Admit: 2016-07-12 | Discharge: 2016-07-12 | Disposition: A | Discharge status: Home / Self Care | Payer: 59 | Source: Ambulatory Visit | Attending: Family Medicine | Admitting: Family Medicine

## 2016-07-12 DIAGNOSIS — E049 Nontoxic goiter, unspecified: Secondary | ICD-10-CM

## 2016-07-12 DIAGNOSIS — R09A2 Foreign body sensation, throat: Secondary | ICD-10-CM

## 2016-07-12 DIAGNOSIS — R0989 Other specified symptoms and signs involving the circulatory and respiratory systems: Secondary | ICD-10-CM

## 2016-07-12 LAB — T3: T3, Total: 85 ng/dL (ref 76–181)

## 2016-07-12 LAB — T4: T4 TOTAL: 10.4 ug/dL (ref 4.5–12.0)

## 2016-07-14 ENCOUNTER — Encounter: Payer: Self-pay | Admitting: Family Medicine

## 2016-07-15 ENCOUNTER — Other Ambulatory Visit: Payer: BLUE CROSS/BLUE SHIELD

## 2016-07-18 ENCOUNTER — Ambulatory Visit: Payer: 59 | Admitting: Family Medicine

## 2016-07-22 LAB — HM PAP SMEAR: HM PAP: NEGATIVE

## 2016-07-26 ENCOUNTER — Ambulatory Visit (INDEPENDENT_AMBULATORY_CARE_PROVIDER_SITE_OTHER): Payer: 59 | Admitting: Family Medicine

## 2016-07-26 ENCOUNTER — Encounter: Payer: Self-pay | Admitting: Family Medicine

## 2016-07-26 VITALS — BP 130/92 | HR 78 | Temp 98.2°F | Resp 14 | Ht 62.0 in | Wt 144.0 lb

## 2016-07-26 DIAGNOSIS — R5382 Chronic fatigue, unspecified: Secondary | ICD-10-CM

## 2016-07-26 DIAGNOSIS — R0989 Other specified symptoms and signs involving the circulatory and respiratory systems: Secondary | ICD-10-CM

## 2016-07-26 DIAGNOSIS — F458 Other somatoform disorders: Secondary | ICD-10-CM | POA: Diagnosis not present

## 2016-07-26 DIAGNOSIS — F411 Generalized anxiety disorder: Secondary | ICD-10-CM | POA: Diagnosis not present

## 2016-07-26 MED ORDER — ESCITALOPRAM OXALATE 10 MG PO TABS: 10.0000 mg | ORAL_TABLET | Freq: Every day | ORAL | 5 refills | Status: DC

## 2016-07-26 NOTE — Progress Notes (Signed)
Subjective:    Patient ID: Griffin Dakin, female    DOB: February 04, 1961, 56 y.o.   MRN: [id number]  HPI  07/03/15 The patient is a very pleasant 56 year old white female who presents with 3-4 weeks of globus sensation in the upper throat above the epiglottis. On one occasion she became choked in her upper esophagus on a [place] sandwich. However the rest of the time she denies any difficulty swallowing liquids or solids. She does feel a swelling or lump in the upper throat above the thyroid gland. She denies any change in her voice. She does report some soreness in that area. There is no palpable lymphadenopathy in the throat. There is no palpable thyromegaly or thyroid nodules. There are no palpable masses. She does occasionally have reflux. She does report more frequent belching. However she also reports increased hair loss in an androgenic pattern and also worsening fatigue. AT that time, my plan was: I believe the patient is having globus sensation secondary to gastroesophageal reflux. Begin Nexium 40 mg by mouth daily. I will consult ENT for laryngoscopy as the majority of her symptoms seem to be located above the epiglottis or in the upper throat. Given the fatigue I will also check a CBC, CMP, TSH, and vitamin B12. I have recommended Rogaine for women for the androgenic hair loss  07/08/16 Patient saw ENT performed direct laryngoscopy and only found inflammation secondary to reflux. She was started on a proton pump inhibitor. She states that she took the medication for several months. It only made symptoms worse.  here today for reevaluation. Her biggest concern is a persistent lump in her throat. She also reports difficulty swallowing at times. At times she reports food sticking however the sensation is in her throat near the thyroid gland. She denies stridor or difficulty breathing. The sensation is constant. There are no alleviating factors. There are no exacerbating factors. On examination today  she does have a small goiter. There are no palpable nodules. There is no lymphadenopathy. Examination the posterior oropharynx is normal. She also reports worsening fatigue. She states that she has no energy. She denies any chest pain or shortness of breath or dyspnea on exertion. She has been dealing with more stress. She denies any depression. She does have some anxiety.  At that time, my plan was: Her exam is reassuring.  However, she has a small tender goiter.  I will check an US of the neck.  Also check cbc, cmp, TSH, Vit B12, and ESR.    Orders Only on 07/08/2016  Component Date Value Ref Range Status  . Cholesterol 07/08/2016 227* <200 mg/dL Final  . Triglycerides 07/08/2016 67  <150 mg/dL Final  . HDL 07/08/2016 67  >50 mg/dL Final  . Total CHOL/HDL Ratio 07/08/2016 3.4  <5.0 Ratio Final  . VLDL 07/08/2016 13  <30 mg/dL Final  . LDL Cholesterol 07/08/2016 147* <100 mg/dL Final  . T4, Total 07/08/2016 10.4  4.5 - 12.0 ug/dL Final  . T3, Total 07/08/2016 85.0  76 - 181 ng/dL Final  Office Visit on 07/08/2016  Component Date Value Ref Range Status  . WBC 07/08/2016 5.9  3.8 - 10.8 K/uL Final  . RBC 07/08/2016 4.87  3.80 - 5.10 MIL/uL Final  . Hemoglobin 07/08/2016 14.0  12.0 - 15.0 g/dL Final  . HCT 07/08/2016 43.0  35.0 - 45.0 % Final  . MCV 07/08/2016 88.3  80.0 - 100.0 fL Final  . MCH 07/08/2016 28.7  27.0 - 33.0 pg  Final  . MCHC 07/08/2016 32.6  32.0 - 36.0 g/dL Final  . RDW 07/08/2016 13.0  11.0 - 15.0 % Final  . Platelets 07/08/2016 270  140 - 400 K/uL Final  . MPV 07/08/2016 10.2  7.5 - 12.5 fL Final  . Neutro Abs 07/08/2016 3717  1,500 - 7,800 cells/uL Final  . Lymphs Abs 07/08/2016 1475  850 - 3,900 cells/uL Final  . Monocytes Absolute 07/08/2016 472  200 - 950 cells/uL Final  . Eosinophils Absolute 07/08/2016 177  15 - 500 cells/uL Final  . Basophils Absolute 07/08/2016 59  0 - 200 cells/uL Final  . Neutrophils Relative % 07/08/2016 63  % Final  . Lymphocytes Relative  07/08/2016 25  % Final  . Monocytes Relative 07/08/2016 8  % Final  . Eosinophils Relative 07/08/2016 3  % Final  . Basophils Relative 07/08/2016 1  % Final  . Smear Review 07/08/2016 Criteria for review not met   Final  . Sodium 07/08/2016 140  135 - 146 mmol/L Final  . Potassium 07/08/2016 4.3  3.5 - 5.3 mmol/L Final  . Chloride 07/08/2016 105  98 - 110 mmol/L Final  . CO2 07/08/2016 26  20 - 31 mmol/L Final  . Glucose, Bld 07/08/2016 93  70 - 99 mg/dL Final  . BUN 07/08/2016 19  7 - 25 mg/dL Final  . Creat 07/08/2016 0.82  0.50 - 1.05 mg/dL Final   Comment:   For patients > or = 57 years of age: The upper reference limit for Creatinine is approximately 13% higher for people identified as African-American.     . Total Bilirubin 07/08/2016 0.5  0.2 - 1.2 mg/dL Final  . Alkaline Phosphatase 07/08/2016 71  33 - 130 U/L Final  . AST 07/08/2016 21  10 - 35 U/L Final  . ALT 07/08/2016 23  6 - 29 U/L Final  . Total Protein 07/08/2016 6.7  6.1 - 8.1 g/dL Final  . Albumin 07/08/2016 4.5  3.6 - 5.1 g/dL Final  . Calcium 07/08/2016 9.6  8.6 - 10.4 mg/dL Final  . GFR, Est African American 07/08/2016 >89  >=60 mL/min Final  . GFR, Est Non African American 07/08/2016 80  >=60 mL/min Final  . TSH 07/08/2016 4.89* mIU/L Final   Comment:   Reference Range   > or = 20 Years  0.40-4.50   Pregnancy Range First trimester  0.26-2.66 Second trimester 0.55-2.73 Third trimester  0.43-2.91     . Sed Rate 07/08/2016 4  0 - 30 mm/hr Final  . Vitamin B-12 07/08/2016 397  200 - 1,100 pg/mL Final   Aside from elevated LDL and mild elevated TSH, labs were unremarkable.  Thyroid US was normal.  No nodules or irregularities seen.    She is here today to discuss her further treatment options. Her biggest concern is the persistent globus sensation in her throat which she now believes could be secondary to anxiety and stress. She does have some mild dysphasia but she states that she's had that for years.  She denies any weight loss. She denies any fevers or chills. She denies any melena or hematochezia. She denies any bone pain or bruising or unexplained rashes. She denies any persistent cough. She really has no red flags other than the globus sensation in her throat and her persistent fatigue   Past Medical History:  Diagnosis Date  . Elevated LFTs   . Hyperlipidemia   . Hypothyroidism   . Hypothyroidism    No past surgical  history on file. Current Outpatient Prescriptions on File Prior to Visit  Medication Sig Dispense Refill  . clobetasol cream (TEMOVATE) 9.51 % APPLY 1 APPLICATION TOPICALLY 2 TIMES DAILY. 45 g 2  . SYNTHROID 88 MCG tablet TAKE 1 TABLET BY MOUTH DAILY BEFORE BREAKFAST. 30 tablet 7  . zolpidem (AMBIEN) 10 MG tablet Take 1 tablet (10 mg total) by mouth at bedtime as needed. for sleep 30 tablet 2   No current facility-administered medications on file prior to visit.    Allergies  Allergen Reactions  . Betadine [Povidone Iodine] Swelling  . Demerol Nausea And Vomiting  . Fish Allergy Nausea And Vomiting  . Shrimp [Shellfish Allergy] Nausea And Vomiting   Social History   Social History  . Marital status: Married    Spouse name: N/A  . Number of children: N/A  . Years of education: N/A   Occupational History  . Not on file.   Social History Main Topics  . Smoking status: Never Smoker  . Smokeless tobacco: Never Used  . Alcohol use 1.8 oz/week    1 Glasses of wine, 1 Cans of beer, 1 Shots of liquor per week  . Drug use: No  . Sexual activity: Not on file     Comment: married to Marlou Sa,   Other Topics Concern  . Not on file   Social History Narrative  . No narrative on file      Review of Systems  All other systems reviewed and are negative.      Objective:   Physical Exam  Constitutional: She appears well-developed and well-nourished. No distress.  HENT:  Nose: Nose normal.  Mouth/Throat: Oropharynx is clear and moist. No oropharyngeal  exudate.  Eyes: Conjunctivae are normal. Pupils are equal, round, and reactive to light. No scleral icterus.  Neck: Neck supple. No JVD present. No tracheal deviation present. Thyromegaly present.  Cardiovascular: Normal rate, regular rhythm and normal heart sounds.  Exam reveals no gallop and no friction rub.   No murmur heard. Pulmonary/Chest: Effort normal and breath sounds normal. No stridor. No respiratory distress. She has no wheezes. She has no rales.  Abdominal: Soft. Bowel sounds are normal. She exhibits no distension and no mass. There is no tenderness. There is no rebound and no guarding.  Musculoskeletal: She exhibits no edema.  Lymphadenopathy:    She has no cervical adenopathy.  Skin: No rash noted. She is not diaphoretic.  Vitals reviewed.        Assessment & Plan:  I believe the patient may be experiencing generalized anxiety disorder. She would like to try Lexapro 10 mg a day and recheck in 4-6 weeks. If symptoms are improving, no further workup is necessary. If her anxiety and globus sensation is improving but the fatigue persist, at that time I will increase Synthroid to 100 g a day to see if it would help the fatigue although the TSH is borderline so we don't see substantial benefit from doing this. Obviously if symptoms worsen or change in any way, further workup may be necessary

## 2016-08-03 ENCOUNTER — Other Ambulatory Visit: Payer: BLUE CROSS/BLUE SHIELD

## 2016-08-09 ENCOUNTER — Encounter: Payer: BLUE CROSS/BLUE SHIELD | Admitting: Family Medicine

## 2016-08-17 ENCOUNTER — Telehealth: Payer: Self-pay | Admitting: Family Medicine

## 2016-08-17 NOTE — Telephone Encounter (Signed)
Pharm requesting refill on Ambien - Ok to refill??       

## 2016-08-18 MED ORDER — ZOLPIDEM TARTRATE 10 MG PO TABS: 10.0000 mg | ORAL_TABLET | Freq: Every evening | ORAL | 2 refills | Status: DC | PRN

## 2016-08-18 NOTE — Telephone Encounter (Signed)
Medication called/sent to requested pharmacy  

## 2016-08-18 NOTE — Telephone Encounter (Signed)
ok 

## 2016-09-15 ENCOUNTER — Encounter: Payer: Self-pay | Admitting: Family Medicine

## 2016-09-16 MED ORDER — LEVOTHYROXINE SODIUM 100 MCG PO TABS: 100.0000 ug | ORAL_TABLET | Freq: Every day | ORAL | 1 refills | Status: DC

## 2016-11-29 ENCOUNTER — Other Ambulatory Visit: Payer: Self-pay | Admitting: Family Medicine

## 2016-11-29 NOTE — Telephone Encounter (Signed)
Ok to refill 

## 2016-11-29 NOTE — Telephone Encounter (Signed)
ok 

## 2016-11-29 NOTE — Telephone Encounter (Signed)
Medication called to pharmacy. 

## 2017-01-23 ENCOUNTER — Other Ambulatory Visit: Payer: Self-pay | Admitting: Family Medicine

## 2017-02-22 ENCOUNTER — Other Ambulatory Visit: Payer: Self-pay | Admitting: Family Medicine

## 2017-02-22 NOTE — Telephone Encounter (Signed)
Ok to refill 

## 2017-02-23 NOTE — Telephone Encounter (Signed)
Sure rf x 3

## 2017-02-23 NOTE — Telephone Encounter (Signed)
Medication called to pharmacy. 

## 2017-03-15 ENCOUNTER — Other Ambulatory Visit: Payer: Self-pay | Admitting: Family Medicine

## 2017-05-10 ENCOUNTER — Encounter: Payer: Self-pay | Admitting: Family Medicine

## 2017-06-23 ENCOUNTER — Other Ambulatory Visit: Payer: 59

## 2017-06-23 DIAGNOSIS — R945 Abnormal results of liver function studies: Secondary | ICD-10-CM

## 2017-06-23 DIAGNOSIS — Z0000 Encounter for general adult medical examination without abnormal findings: Secondary | ICD-10-CM

## 2017-06-23 DIAGNOSIS — Z79899 Other long term (current) drug therapy: Secondary | ICD-10-CM

## 2017-06-23 DIAGNOSIS — E032 Hypothyroidism due to medicaments and other exogenous substances: Secondary | ICD-10-CM

## 2017-06-23 DIAGNOSIS — R7989 Other specified abnormal findings of blood chemistry: Secondary | ICD-10-CM

## 2017-06-23 LAB — LIPID PANEL
CHOL/HDL RATIO: 3.8 (calc) (ref <5.0)
CHOLESTEROL: 205 mg/dL — AB (ref <200)
HDL: 54 mg/dL (ref >50)
LDL Cholesterol (Calc): 137 mg/dL (calc) — ABNORMAL HIGH
NON-HDL CHOLESTEROL (CALC): 151 mg/dL — AB (ref <130)
Triglycerides: 50 mg/dL (ref <150)

## 2017-06-23 LAB — CBC WITH DIFFERENTIAL/PLATELET
BASOS PCT: 1.2 %
Basophils Absolute: 62 cells/uL (ref 0–200)
EOS ABS: 187 {cells}/uL (ref 15–500)
Eosinophils Relative: 3.6 %
HEMATOCRIT: 42.2 % (ref 35.0–45.0)
Hemoglobin: 14.4 g/dL (ref 11.7–15.5)
LYMPHS ABS: 1269 {cells}/uL (ref 850–3900)
MCH: 29.3 pg (ref 27.0–33.0)
MCHC: 34.1 g/dL (ref 32.0–36.0)
MCV: 85.8 fL (ref 80.0–100.0)
MPV: 10.9 fL (ref 7.5–12.5)
Monocytes Relative: 10 %
Neutro Abs: 3162 cells/uL (ref 1500–7800)
Neutrophils Relative %: 60.8 %
Platelets: 289 10*3/uL (ref 140–400)
RBC: 4.92 10*6/uL (ref 3.80–5.10)
RDW: 11.7 % (ref 11.0–15.0)
TOTAL LYMPHOCYTE: 24.4 %
WBC: 5.2 10*3/uL (ref 3.8–10.8)
WBCMIX: 520 {cells}/uL (ref 200–950)

## 2017-06-23 LAB — COMPREHENSIVE METABOLIC PANEL
AG Ratio: 2 (calc) (ref 1.0–2.5)
ALBUMIN MSPROF: 4.5 g/dL (ref 3.6–5.1)
ALKALINE PHOSPHATASE (APISO): 80 U/L (ref 33–130)
ALT: 33 U/L — ABNORMAL HIGH (ref 6–29)
AST: 30 U/L (ref 10–35)
BUN: 15 mg/dL (ref 7–25)
CHLORIDE: 105 mmol/L (ref 98–110)
CO2: 30 mmol/L (ref 20–32)
CREATININE: 0.89 mg/dL (ref 0.50–1.05)
Calcium: 9.8 mg/dL (ref 8.6–10.4)
GLOBULIN: 2.2 g/dL (ref 1.9–3.7)
Glucose, Bld: 96 mg/dL (ref 65–99)
POTASSIUM: 5.4 mmol/L — AB (ref 3.5–5.3)
SODIUM: 142 mmol/L (ref 135–146)
TOTAL PROTEIN: 6.7 g/dL (ref 6.1–8.1)
Total Bilirubin: 0.4 mg/dL (ref 0.2–1.2)

## 2017-06-23 LAB — TSH: TSH: 1.02 m[IU]/L (ref 0.40–4.50)

## 2017-06-28 ENCOUNTER — Encounter: Payer: Self-pay | Admitting: Family Medicine

## 2017-06-29 ENCOUNTER — Encounter: Payer: Self-pay | Admitting: Family Medicine

## 2017-06-29 ENCOUNTER — Ambulatory Visit (INDEPENDENT_AMBULATORY_CARE_PROVIDER_SITE_OTHER): Payer: 59 | Admitting: Family Medicine

## 2017-06-29 ENCOUNTER — Telehealth: Payer: Self-pay | Admitting: *Deleted

## 2017-06-29 ENCOUNTER — Other Ambulatory Visit: Payer: Self-pay

## 2017-06-29 ENCOUNTER — Encounter: Payer: Self-pay | Admitting: *Deleted

## 2017-06-29 VITALS — BP 128/88 | HR 88 | Temp 97.9°F | Resp 12 | Ht 62.0 in | Wt 146.0 lb

## 2017-06-29 DIAGNOSIS — E039 Hypothyroidism, unspecified: Secondary | ICD-10-CM | POA: Diagnosis not present

## 2017-06-29 DIAGNOSIS — Z23 Encounter for immunization: Secondary | ICD-10-CM | POA: Diagnosis not present

## 2017-06-29 DIAGNOSIS — R5382 Chronic fatigue, unspecified: Secondary | ICD-10-CM

## 2017-06-29 DIAGNOSIS — Z0000 Encounter for general adult medical examination without abnormal findings: Secondary | ICD-10-CM | POA: Diagnosis not present

## 2017-06-29 NOTE — Telephone Encounter (Signed)
Received verbal orders for Cologuard.   Order placed via [facility].   Order [id number] has been submitted.

## 2017-06-29 NOTE — Progress Notes (Signed)
Subjective:    Patient ID: Meghan Lozano, female    DOB: 1960/10/31, 57 y.o.   MRN: [id number]  HPI  07/03/15 The patient is a very pleasant 57 year old white female who presents with 3-4 weeks of globus sensation in the upper throat above the epiglottis. On one occasion she became choked in her upper esophagus on a [place] sandwich. However the rest of the time she denies any difficulty swallowing liquids or solids. She does feel a swelling or lump in the upper throat above the thyroid gland. She denies any change in her voice. She does report some soreness in that area. There is no palpable lymphadenopathy in the throat. There is no palpable thyromegaly or thyroid nodules. There are no palpable masses. She does occasionally have reflux. She does report more frequent belching. However she also reports increased hair loss in an androgenic pattern and also worsening fatigue. AT that time, my plan was: I believe the patient is having globus sensation secondary to gastroesophageal reflux. Begin Nexium 40 mg by mouth daily. I will consult ENT for laryngoscopy as the majority of her symptoms seem to be located above the epiglottis or in the upper throat. Given the fatigue I will also check a CBC, CMP, TSH, and vitamin B12. I have recommended Rogaine for women for the androgenic hair loss  07/08/16 Patient saw ENT performed direct laryngoscopy and only found inflammation secondary to reflux. She was started on a proton pump inhibitor. She states that she took the medication for several months. It only made symptoms worse.  here today for reevaluation. Her biggest concern is a persistent lump in her throat. She also reports difficulty swallowing at times. At times she reports food sticking however the sensation is in her throat near the thyroid gland. She denies stridor or difficulty breathing. The sensation is constant. There are no alleviating factors. There are no exacerbating factors. On examination today  she does have a small goiter. There are no palpable nodules. There is no lymphadenopathy. Examination the posterior oropharynx is normal. She also reports worsening fatigue. She states that she has no energy. She denies any chest pain or shortness of breath or dyspnea on exertion. She has been dealing with more stress. She denies any depression. She does have some anxiety.  At that time, my plan was: Her exam is reassuring.  However, she has a small tender goiter.  I will check an US of the neck.  Also check cbc, cmp, TSH, Vit B12, and ESR.   Aside from elevated LDL and mild elevated TSH, labs were unremarkable.  Thyroid US was normal.  No nodules or irregularities seen.    She is here today to discuss her further treatment options. Her biggest concern is the persistent globus sensation in her throat which she now believes could be secondary to anxiety and stress. She does have some mild dysphasia but she states that she's had that for years. She denies any weight loss. She denies any fevers or chills. She denies any melena or hematochezia. She denies any bone pain or bruising or unexplained rashes. She denies any persistent cough. She really has no red flags other than the globus sensation in her throat and her persistent fatigue.  AT that time, my plan was: I believe the patient may be experiencing generalized anxiety disorder. She would like to try Lexapro 10 mg a day and recheck in 4-6 weeks. If symptoms are improving, no further workup is necessary. If her anxiety and globus  sensation is improving but the fatigue persist, at that time I will increase Synthroid to 100 g a day to see if it would help the fatigue although the TSH is borderline so we don't see substantial benefit from doing this. Obviously if symptoms worsen or change in any way, further workup may be necessary  06/29/17  Patient is here today for a complete physical exam.  She sees a gynecologist who performs her mammograms as well as her  Pap smear.  She is overdue for a colonoscopy.  She continues to refuse a colonoscopy however she would consider cologuard.  Patient is due for HIV screening but she politely declines that.  She is due for a tetanus vaccine today as well as the shingles vaccine.  She continues to endorse chronic fatigue.  This is been a chronic problem that the patient has mentioned more than a year.  Her weight remains stable.  She denies any fevers or chills.  She denies any night sweats or bone pain.  Her biggest concern with the fatigue is her inability to lose weight.  However her BMI is only mildly elevated at 26.  Patient some benefit from the Lexapro with regarding her anxiety however she discontinue the medication due to weight gain as well as problems with focus.   her most recent lab work as listed below: Lab on 06/23/2017  Component Date Value Ref Range Status  . WBC 06/23/2017 5.2  3.8 - 10.8 Thousand/uL Final  . RBC 06/23/2017 4.92  3.80 - 5.10 Million/uL Final  . Hemoglobin 06/23/2017 14.4  11.7 - 15.5 g/dL Final  . HCT 06/23/2017 42.2  35.0 - 45.0 % Final  . MCV 06/23/2017 85.8  80.0 - 100.0 fL Final  . MCH 06/23/2017 29.3  27.0 - 33.0 pg Final  . MCHC 06/23/2017 34.1  32.0 - 36.0 g/dL Final  . RDW 06/23/2017 11.7  11.0 - 15.0 % Final  . Platelets 06/23/2017 289  140 - 400 Thousand/uL Final  . MPV 06/23/2017 10.9  7.5 - 12.5 fL Final  . Neutro Abs 06/23/2017 3,162  1,500 - 7,800 cells/uL Final  . Lymphs Abs 06/23/2017 1,269  850 - 3,900 cells/uL Final  . WBC mixed population 06/23/2017 520  200 - 950 cells/uL Final  . Eosinophils Absolute 06/23/2017 187  15 - 500 cells/uL Final  . Basophils Absolute 06/23/2017 62  0 - 200 cells/uL Final  . Neutrophils Relative % 06/23/2017 60.8  % Final  . Total Lymphocyte 06/23/2017 24.4  % Final  . Monocytes Relative 06/23/2017 10.0  % Final  . Eosinophils Relative 06/23/2017 3.6  % Final  . Basophils Relative 06/23/2017 1.2  % Final  . Glucose, Bld 06/23/2017  96  65 - 99 mg/dL Final   Comment: .            Fasting reference interval .   . BUN 06/23/2017 15  7 - 25 mg/dL Final  . Creat 06/23/2017 0.89  0.50 - 1.05 mg/dL Final   Comment: For patients >91 years of age, the reference limit for Creatinine is approximately 13% higher for people identified as African-American. .   Havery Moros Ratio 67/02/4579 NOT APPLICABLE  6 - 22 (calc) Final  . Sodium 06/23/2017 142  135 - 146 mmol/L Final  . Potassium 06/23/2017 5.4* 3.5 - 5.3 mmol/L Final  . Chloride 06/23/2017 105  98 - 110 mmol/L Final  . CO2 06/23/2017 30  20 - 32 mmol/L Final  . Calcium 06/23/2017 9.8  8.6 - 10.4 mg/dL Final  . Total Protein 06/23/2017 6.7  6.1 - 8.1 g/dL Final  . Albumin 06/23/2017 4.5  3.6 - 5.1 g/dL Final  . Globulin 06/23/2017 2.2  1.9 - 3.7 g/dL (calc) Final  . AG Ratio 06/23/2017 2.0  1.0 - 2.5 (calc) Final  . Total Bilirubin 06/23/2017 0.4  0.2 - 1.2 mg/dL Final  . Alkaline phosphatase (APISO) 06/23/2017 80  33 - 130 U/L Final  . AST 06/23/2017 30  10 - 35 U/L Final  . ALT 06/23/2017 33* 6 - 29 U/L Final  . Cholesterol 06/23/2017 205* <200 mg/dL Final  . HDL 06/23/2017 54  >50 mg/dL Final  . Triglycerides 06/23/2017 50  <150 mg/dL Final  . LDL Cholesterol (Calc) 06/23/2017 137* mg/dL (calc) Final   Comment: Reference range: <100 . Desirable range <100 mg/dL for primary prevention;   <70 mg/dL for patients with CHD or diabetic patients  with > or = 2 CHD risk factors. Marland Kitchen LDL-C is now calculated using the Martin-Hopkins  calculation, which is a validated novel method providing  better accuracy than the Friedewald equation in the  estimation of LDL-C.  Cresenciano Genre et al. Annamaria Helling. 7893;810(17): 2061-2068  (http://education.[facility].com/faq/FAQ164)   . Total CHOL/HDL Ratio 06/23/2017 3.8  <5.0 (calc) Final  . Non-HDL Cholesterol (Calc) 06/23/2017 151* <130 mg/dL (calc) Final   Comment: For patients with diabetes plus 1 major ASCVD risk  factor,  treating to a non-HDL-C goal of <100 mg/dL  (LDL-C of <70 mg/dL) is considered a therapeutic  option.   . TSH 06/23/2017 1.02  0.40 - 4.50 mIU/L Final    Past Medical History:  Diagnosis Date  . Elevated LFTs   . Hyperlipidemia   . Hypothyroidism   . Hypothyroidism    History reviewed. No pertinent surgical history. Current Outpatient Medications on File Prior to Visit  Medication Sig Dispense Refill  . SYNTHROID 100 MCG tablet TAKE 1 TABLET BY MOUTH DAILY BEFORE BREAKFAST. 90 tablet 1  . zolpidem (AMBIEN) 10 MG tablet TAKE 1 TABLET BY MOUTH AT BEDTIME AS NEEDED FOR INSOMNIA 30 tablet 3   No current facility-administered medications on file prior to visit.    Allergies  Allergen Reactions  . Betadine [Povidone Iodine] Swelling  . Demerol Nausea And Vomiting  . Fish Allergy Nausea And Vomiting  . Shrimp [Shellfish Allergy] Nausea And Vomiting   Social History   Socioeconomic History  . Marital status: Married    Spouse name: Not on file  . Number of children: Not on file  . Years of education: Not on file  . Highest education level: Not on file  Occupational History  . Not on file  Social Needs  . Financial resource strain: Not on file  . Food insecurity:    Worry: Not on file    Inability: Not on file  . Transportation needs:    Medical: Not on file    Non-medical: Not on file  Tobacco Use  . Smoking status: Never Smoker  . Smokeless tobacco: Never Used  Substance and Sexual Activity  . Alcohol use: Yes    Alcohol/week: 1.8 oz    Types: 1 Glasses of wine, 1 Cans of beer, 1 Shots of liquor per week  . Drug use: No  . Sexual activity: Not on file    Comment: married to [employer],  Lifestyle  . Physical activity:    Days per week: Not on file    Minutes per session: Not on file  .  Stress: Not on file  Relationships  . Social connections:    Talks on phone: Not on file    Gets together: Not on file    Attends religious service: Not on file    Active member of  club or organization: Not on file    Attends meetings of clubs or organizations: Not on file    Relationship status: Not on file  . Intimate partner violence:    Fear of current or ex partner: Not on file    Emotionally abused: Not on file    Physically abused: Not on file    Forced sexual activity: Not on file  Other Topics Concern  . Not on file  Social History Narrative  . Not on file      Review of Systems  All other systems reviewed and are negative.      Objective:   Physical Exam  Constitutional: She is oriented to person, place, and time. She appears well-developed and well-nourished. No distress.  Eyes: Pupils are equal, round, and reactive to light. Conjunctivae are normal. No scleral icterus.  Neck: Neck supple. No JVD present. No thyromegaly present.  Cardiovascular: Normal rate, regular rhythm and normal heart sounds. Exam reveals no gallop and no friction rub.  No murmur heard. Pulmonary/Chest: Effort normal and breath sounds normal. No respiratory distress. She has no wheezes. She has no rales.  Abdominal: Soft. Bowel sounds are normal.  Musculoskeletal: She exhibits no edema.  Neurological: She is alert and oriented to person, place, and time. No cranial nerve deficit. She exhibits normal muscle tone. Coordination normal.  Skin: She is not diaphoretic.  Psychiatric: She has a normal mood and affect. Her behavior is normal. Judgment and thought content normal.  Vitals reviewed.        Assessment & Plan:  Hypothyroidism, unspecified type  General medical exam  Chronic fatigue - Plan: Cortisol  Need for tetanus, diphtheria, and acellular pertussis (Tdap) vaccine in patient of adolescent age or older - Plan: Tdap vaccine greater than or equal to 7yo IM  Patient's lab work is outstanding aside from elevations in her cholesterol which do not warrant medication.  Her 10-year risk of coronary disease is less than 7.5%.  Recommended the shingles vaccine.   Patient received Tdap.  Recommended colonoscopy but the patient refused.  However she will allow me to schedule her for cologuard.  She denies HIV screening.  I will check a cortisol level to evaluate for possible signs of adrenal insufficiency.  Otherwise I reassured the patient that I believe her fatigue is not due to a serious underlying medical problem but rather could be due to the normal aging process as well as postmenopausal status.  Physical exam was limited today due to the patient having to care for her grandchild during the visit.

## 2017-06-30 ENCOUNTER — Encounter (INDEPENDENT_AMBULATORY_CARE_PROVIDER_SITE_OTHER): Payer: Self-pay

## 2017-06-30 LAB — CORTISOL: Cortisol, Plasma: 10.9 ug/dL

## 2017-07-13 ENCOUNTER — Other Ambulatory Visit: Payer: Self-pay | Admitting: Family Medicine

## 2017-07-13 NOTE — Telephone Encounter (Signed)
Ok to refill??  Last office visit 06/29/2017.  Last refill 02/23/2017, #3 refills.

## 2017-08-09 LAB — COLOGUARD

## 2017-08-11 ENCOUNTER — Encounter: Payer: Self-pay | Admitting: *Deleted

## 2017-08-11 NOTE — Telephone Encounter (Signed)
Received Cologuard results.   Noted negative.   Patient made aware.

## 2017-09-11 ENCOUNTER — Other Ambulatory Visit: Payer: Self-pay | Admitting: Family Medicine

## 2017-11-17 ENCOUNTER — Other Ambulatory Visit: Payer: Self-pay | Admitting: Family Medicine

## 2017-11-17 ENCOUNTER — Ambulatory Visit (INDEPENDENT_AMBULATORY_CARE_PROVIDER_SITE_OTHER): Payer: 59 | Admitting: Family Medicine

## 2017-11-17 ENCOUNTER — Encounter: Payer: Self-pay | Admitting: Family Medicine

## 2017-11-17 ENCOUNTER — Ambulatory Visit
Admission: RE | Admit: 2017-11-17 | Discharge: 2017-11-17 | Disposition: A | Discharge status: Home / Self Care | Payer: 59 | Source: Ambulatory Visit | Attending: Family Medicine | Admitting: Family Medicine

## 2017-11-17 VITALS — BP 142/100 | HR 76 | Temp 98.6°F | Resp 16 | Ht 62.0 in | Wt 143.0 lb

## 2017-11-17 DIAGNOSIS — R5382 Chronic fatigue, unspecified: Secondary | ICD-10-CM | POA: Diagnosis not present

## 2017-11-17 DIAGNOSIS — M791 Myalgia, unspecified site: Secondary | ICD-10-CM | POA: Diagnosis not present

## 2017-11-17 DIAGNOSIS — M542 Cervicalgia: Secondary | ICD-10-CM | POA: Diagnosis not present

## 2017-11-17 DIAGNOSIS — M2550 Pain in unspecified joint: Secondary | ICD-10-CM | POA: Diagnosis not present

## 2017-11-17 NOTE — Progress Notes (Signed)
Subjective:    Patient ID: Meghan Lozano, female    DOB: 1960/10/31, 57 y.o.   MRN: [id number]  HPI  07/03/15 The patient is a very pleasant 57 year old white female who presents with 3-4 weeks of globus sensation in the upper throat above the epiglottis. On one occasion she became choked in her upper esophagus on a [place] sandwich. However the rest of the time she denies any difficulty swallowing liquids or solids. She does feel a swelling or lump in the upper throat above the thyroid gland. She denies any change in her voice. She does report some soreness in that area. There is no palpable lymphadenopathy in the throat. There is no palpable thyromegaly or thyroid nodules. There are no palpable masses. She does occasionally have reflux. She does report more frequent belching. However she also reports increased hair loss in an androgenic pattern and also worsening fatigue. AT that time, my plan was: I believe the patient is having globus sensation secondary to gastroesophageal reflux. Begin Nexium 40 mg by mouth daily. I will consult ENT for laryngoscopy as the majority of her symptoms seem to be located above the epiglottis or in the upper throat. Given the fatigue I will also check a CBC, CMP, TSH, and vitamin B12. I have recommended Rogaine for women for the androgenic hair loss  07/08/16 Patient saw ENT performed direct laryngoscopy and only found inflammation secondary to reflux. She was started on a proton pump inhibitor. She states that she took the medication for several months. It only made symptoms worse.  here today for reevaluation. Her biggest concern is a persistent lump in her throat. She also reports difficulty swallowing at times. At times she reports food sticking however the sensation is in her throat near the thyroid gland. She denies stridor or difficulty breathing. The sensation is constant. There are no alleviating factors. There are no exacerbating factors. On examination today  she does have a small goiter. There are no palpable nodules. There is no lymphadenopathy. Examination the posterior oropharynx is normal. She also reports worsening fatigue. She states that she has no energy. She denies any chest pain or shortness of breath or dyspnea on exertion. She has been dealing with more stress. She denies any depression. She does have some anxiety.  At that time, my plan was: Her exam is reassuring.  However, she has a small tender goiter.  I will check an US of the neck.  Also check cbc, cmp, TSH, Vit B12, and ESR.   Aside from elevated LDL and mild elevated TSH, labs were unremarkable.  Thyroid US was normal.  No nodules or irregularities seen.    She is here today to discuss her further treatment options. Her biggest concern is the persistent globus sensation in her throat which she now believes could be secondary to anxiety and stress. She does have some mild dysphasia but she states that she's had that for years. She denies any weight loss. She denies any fevers or chills. She denies any melena or hematochezia. She denies any bone pain or bruising or unexplained rashes. She denies any persistent cough. She really has no red flags other than the globus sensation in her throat and her persistent fatigue.  AT that time, my plan was: I believe the patient may be experiencing generalized anxiety disorder. She would like to try Lexapro 10 mg a day and recheck in 4-6 weeks. If symptoms are improving, no further workup is necessary. If her anxiety and globus  sensation is improving but the fatigue persist, at that time I will increase Synthroid to 100 g a day to see if it would help the fatigue although the TSH is borderline so we don't see substantial benefit from doing this. Obviously if symptoms worsen or change in any way, further workup may be necessary  06/29/17  Patient is here today for a complete physical exam.  She sees a gynecologist who performs her mammograms as well as her  Pap smear.  She is overdue for a colonoscopy.  She continues to refuse a colonoscopy however she would consider cologuard.  Patient is due for HIV screening but she politely declines that.  She is due for a tetanus vaccine today as well as the shingles vaccine.  She continues to endorse chronic fatigue.  This is been a chronic problem that the patient has mentioned more than a year.  Her weight remains stable.  She denies any fevers or chills.  She denies any night sweats or bone pain.  Her biggest concern with the fatigue is her inability to lose weight.  However her BMI is only mildly elevated at 26.  Patient some benefit from the Lexapro with regarding her anxiety however she discontinue the medication due to weight gain as well as problems with focus.   At that time, my plan was: Patient's lab work is outstanding aside from elevations in her cholesterol which do not warrant medication.  Her 10-year risk of coronary disease is less than 7.5%.  Recommended the shingles vaccine.  Patient received Tdap.  Recommended colonoscopy but the patient refused.  However she will allow me to schedule her for cologuard.  She denies HIV screening.  I will check a cortisol level to evaluate for possible signs of adrenal insufficiency.  Otherwise I reassured the patient that I believe her fatigue is not due to a serious underlying medical problem but rather could be due to the normal aging process as well as postmenopausal status.  Physical exam was limited today due to the patient having to care for her grandchild during the visit.  11/17/17 Patient again presents today with similar symptoms of chronic fatigue.  However over the last 2 to 3 weeks, she is developed some more concerning findings.  She is reporting pain in her neck.  The pain radiates into both shoulders.  She has tenderness to palpation of the paraspinal muscles on both sides of her cervical spine.  She reports numbness and tingling radiating into both  shoulders.  She reports a deep shooting electrical pain particularly radiating into her left shoulder.  She reports occasional waxing and waning numbness and tingling in her hands.  All of her symptoms are concerning for possible neuropathy of undetermined cause.  She also has mild constant headache occipital in location.  She denies any fevers or chills or weight loss.  She also occasionally reports hip pain.  However the fatigue seems to be worsening.  She states that time she has to take a break simply fixing her hair.  Often she just goes home and rest because she does not have the energy to finish the day.  Fatigue is out of proportion to physical conditioning.   Past Medical History:  Diagnosis Date  . Elevated LFTs   . Hyperlipidemia   . Hypothyroidism   . Hypothyroidism    No past surgical history on file. Current Outpatient Medications on File Prior to Visit  Medication Sig Dispense Refill  . SYNTHROID 100 MCG tablet TAKE  1 TABLET BY MOUTH DAILY BEFORE BREAKFAST. 90 tablet 1  . zolpidem (AMBIEN) 10 MG tablet TAKE 1 TABLET BY MOUTH AT BEDTIME AS NEEDED FOR INSOMNIA 30 tablet 3   No current facility-administered medications on file prior to visit.    Allergies  Allergen Reactions  . Betadine [Povidone Iodine] Swelling  . Demerol Nausea And Vomiting  . Fish Allergy Nausea And Vomiting  . Shrimp [Shellfish Allergy] Nausea And Vomiting   Social History   Socioeconomic History  . Marital status: Married    Spouse name: Not on file  . Number of children: Not on file  . Years of education: Not on file  . Highest education level: Not on file  Occupational History  . Not on file  Social Needs  . Financial resource strain: Not on file  . Food insecurity:    Worry: Not on file    Inability: Not on file  . Transportation needs:    Medical: Not on file    Non-medical: Not on file  Tobacco Use  . Smoking status: Never Smoker  . Smokeless tobacco: Never Used  Substance and  Sexual Activity  . Alcohol use: Yes    Alcohol/week: 3.0 standard drinks    Types: 1 Glasses of wine, 1 Cans of beer, 1 Shots of liquor per week  . Drug use: No  . Sexual activity: Not on file    Comment: married to [employer],  Lifestyle  . Physical activity:    Days per week: Not on file    Minutes per session: Not on file  . Stress: Not on file  Relationships  . Social connections:    Talks on phone: Not on file    Gets together: Not on file    Attends religious service: Not on file    Active member of club or organization: Not on file    Attends meetings of clubs or organizations: Not on file    Relationship status: Not on file  . Intimate partner violence:    Fear of current or ex partner: Not on file    Emotionally abused: Not on file    Physically abused: Not on file    Forced sexual activity: Not on file  Other Topics Concern  . Not on file  Social History Narrative  . Not on file      Review of Systems  All other systems reviewed and are negative.      Objective:   Physical Exam  Constitutional: She is oriented to person, place, and time. She appears well-developed and well-nourished. No distress.  Eyes: Pupils are equal, round, and reactive to light. Conjunctivae are normal. No scleral icterus.  Neck: Neck supple. No JVD present. No thyromegaly present.  Cardiovascular: Normal rate, regular rhythm and normal heart sounds. Exam reveals no gallop and no friction rub.  No murmur heard. Pulmonary/Chest: Effort normal and breath sounds normal. No respiratory distress. She has no wheezes. She has no rales.  Abdominal: Soft. Bowel sounds are normal.  Musculoskeletal: She exhibits no edema.       Cervical back: She exhibits tenderness and bony tenderness. She exhibits normal range of motion, no pain and no spasm.  Neurological: She is alert and oriented to person, place, and time. She displays normal reflexes. No cranial nerve deficit. She exhibits normal muscle tone.  Coordination normal.  Skin: She is not diaphoretic.  Psychiatric: She has a normal mood and affect. Her behavior is normal. Judgment and thought content normal.  Vitals reviewed.        Assessment & Plan:  Polyarthralgia - Plan: ANA, Sedimentation rate, B. burgdorfi antibodies by WB  Neck pain - Plan: DG Cervical Spine Complete, Sedimentation rate  Chronic fatigue - Plan: CBC with Differential/Platelet, B. burgdorfi antibodies by WB, TSH  Myalgia - Plan: ANA, Sedimentation rate, CK, B. burgdorfi antibodies by WB  Begin by starting a laboratory work-up for autoimmune diseases including a sedimentation rate, and ANA.  Evaluate for possible chronic Lyme disease with Lyme titers.  Check for myositis with a CK level and a sedimentation rate.  Begin also by obtaining an x-ray of the cervical spine to evaluate for degenerative disc disease, etc.  If lab work and x-rays are completely normal, my next concern will be to obtain an MRI of the cervical spine to evaluate for possible causes of neuropathy.  Multiple sclerosis is certainly on the differential diagnosis given her severe fatigue and her unusual pattern of neurologic deficits.  Also the differential diagnosis would be chronic fatigue syndrome as well as myofascial pain syndrome, fibromyalgia.  If all of the above studies are normal, we may get a second opinion with endocrinology given her severe fatigue and possible even rheumatology.  Await the results of her testing.

## 2017-11-17 NOTE — Telephone Encounter (Signed)
Ok to refill??  Last office visit 11/17/2017.  Last refill 07/13/2017.

## 2017-11-21 ENCOUNTER — Encounter: Payer: Self-pay | Admitting: Family Medicine

## 2017-11-21 ENCOUNTER — Other Ambulatory Visit: Payer: Self-pay | Admitting: *Deleted

## 2017-11-21 DIAGNOSIS — M542 Cervicalgia: Secondary | ICD-10-CM

## 2017-11-21 DIAGNOSIS — M2550 Pain in unspecified joint: Secondary | ICD-10-CM

## 2017-11-21 DIAGNOSIS — R5382 Chronic fatigue, unspecified: Secondary | ICD-10-CM

## 2017-11-22 LAB — CBC WITH DIFFERENTIAL/PLATELET
Basophils Absolute: 68 cells/uL (ref 0–200)
Basophils Relative: 1.2 %
EOS ABS: 200 {cells}/uL (ref 15–500)
Eosinophils Relative: 3.5 %
HEMATOCRIT: 42.7 % (ref 35.0–45.0)
HEMOGLOBIN: 14.2 g/dL (ref 11.7–15.5)
LYMPHS ABS: 1379 {cells}/uL (ref 850–3900)
MCH: 29.3 pg (ref 27.0–33.0)
MCHC: 33.3 g/dL (ref 32.0–36.0)
MCV: 88 fL (ref 80.0–100.0)
MPV: 10.8 fL (ref 7.5–12.5)
Monocytes Relative: 8.8 %
NEUTROS ABS: 3551 {cells}/uL (ref 1500–7800)
Neutrophils Relative %: 62.3 %
PLATELETS: 311 10*3/uL (ref 140–400)
RBC: 4.85 10*6/uL (ref 3.80–5.10)
RDW: 11.8 % (ref 11.0–15.0)
TOTAL LYMPHOCYTE: 24.2 %
WBC mixed population: 502 cells/uL (ref 200–950)
WBC: 5.7 10*3/uL (ref 3.8–10.8)

## 2017-11-22 LAB — B. BURGDORFI ANTIBODIES BY WB
B burgdorferi IgG Abs (IB): NEGATIVE
B burgdorferi IgM Abs (IB): NEGATIVE
LYME DISEASE 23 KD IGG: NONREACTIVE
LYME DISEASE 23 KD IGM: NONREACTIVE
LYME DISEASE 39 KD IGM: NONREACTIVE
LYME DISEASE 41 KD IGG: NONREACTIVE
LYME DISEASE 41 KD IGM: NONREACTIVE
Lyme Disease 18 kD IgG: REACTIVE — AB
Lyme Disease 28 kD IgG: NONREACTIVE
Lyme Disease 30 kD IgG: NONREACTIVE
Lyme Disease 39 kD IgG: NONREACTIVE
Lyme Disease 45 kD IgG: NONREACTIVE
Lyme Disease 58 kD IgG: NONREACTIVE
Lyme Disease 66 kD IgG: NONREACTIVE
Lyme Disease 93 kD IgG: NONREACTIVE

## 2017-11-22 LAB — SEDIMENTATION RATE: Sed Rate: 2 mm/h (ref 0–30)

## 2017-11-22 LAB — TSH: TSH: 3.91 mIU/L (ref 0.40–4.50)

## 2017-11-22 LAB — CK: CK TOTAL: 277 U/L — AB (ref 29–143)

## 2017-11-22 LAB — ANA: Anti Nuclear Antibody(ANA): NEGATIVE

## 2017-11-23 ENCOUNTER — Other Ambulatory Visit: Payer: Self-pay | Admitting: Family Medicine

## 2017-11-23 DIAGNOSIS — M79602 Pain in left arm: Secondary | ICD-10-CM

## 2017-11-23 DIAGNOSIS — R202 Paresthesia of skin: Secondary | ICD-10-CM

## 2017-11-23 DIAGNOSIS — M5412 Radiculopathy, cervical region: Secondary | ICD-10-CM

## 2017-11-23 DIAGNOSIS — M6281 Muscle weakness (generalized): Secondary | ICD-10-CM

## 2017-11-23 DIAGNOSIS — M79601 Pain in right arm: Secondary | ICD-10-CM

## 2017-11-27 ENCOUNTER — Other Ambulatory Visit: Payer: 59

## 2017-11-27 DIAGNOSIS — M2550 Pain in unspecified joint: Secondary | ICD-10-CM

## 2017-11-27 DIAGNOSIS — R5382 Chronic fatigue, unspecified: Secondary | ICD-10-CM

## 2017-11-27 DIAGNOSIS — M542 Cervicalgia: Secondary | ICD-10-CM

## 2017-11-27 LAB — CK: Total CK: 112 U/L (ref 29–143)

## 2017-11-28 ENCOUNTER — Other Ambulatory Visit: Payer: Self-pay | Admitting: Family Medicine

## 2017-11-28 DIAGNOSIS — M6281 Muscle weakness (generalized): Secondary | ICD-10-CM

## 2017-11-28 DIAGNOSIS — M5412 Radiculopathy, cervical region: Secondary | ICD-10-CM

## 2017-11-28 DIAGNOSIS — M79602 Pain in left arm: Principal | ICD-10-CM

## 2017-11-28 DIAGNOSIS — R202 Paresthesia of skin: Principal | ICD-10-CM

## 2017-11-28 DIAGNOSIS — M79601 Pain in right arm: Secondary | ICD-10-CM

## 2017-12-01 ENCOUNTER — Other Ambulatory Visit: Payer: 59

## 2017-12-04 ENCOUNTER — Other Ambulatory Visit: Payer: 59

## 2017-12-04 NOTE — Telephone Encounter (Signed)
Can we reschedule MRI of c spine and also brain for  Paresthesias of all four extremities Cervical radiculopathy in both arms Neck pain eval for MS  I will be glad to try a peer to peer to get it approved.

## 2017-12-05 ENCOUNTER — Encounter: Payer: Self-pay | Admitting: Family Medicine

## 2017-12-05 ENCOUNTER — Other Ambulatory Visit: Payer: Self-pay | Admitting: Family Medicine

## 2017-12-05 DIAGNOSIS — M79601 Pain in right arm: Secondary | ICD-10-CM

## 2017-12-05 DIAGNOSIS — M542 Cervicalgia: Secondary | ICD-10-CM

## 2017-12-05 DIAGNOSIS — M79602 Pain in left arm: Principal | ICD-10-CM

## 2017-12-05 DIAGNOSIS — M6281 Muscle weakness (generalized): Secondary | ICD-10-CM

## 2017-12-05 DIAGNOSIS — M5412 Radiculopathy, cervical region: Secondary | ICD-10-CM

## 2017-12-05 DIAGNOSIS — R202 Paresthesia of skin: Principal | ICD-10-CM

## 2017-12-05 NOTE — Progress Notes (Unsigned)
Dr. Tanya NonesPickard spoke with MD clinical reviewer with [facility] for a peer to peer on MRI cerivcal w/o. The Provider with [facility] Authorized the MRI Cervical w and w/o authorization number is [id number]. Will call and get MRI scheduled and notify patient that MRI was approved.

## 2017-12-12 ENCOUNTER — Ambulatory Visit
Admission: RE | Admit: 2017-12-12 | Discharge: 2017-12-12 | Disposition: A | Discharge status: Home / Self Care | Payer: 59 | Source: Ambulatory Visit | Attending: Family Medicine | Admitting: Family Medicine

## 2017-12-12 DIAGNOSIS — M79601 Pain in right arm: Secondary | ICD-10-CM

## 2017-12-12 DIAGNOSIS — M6281 Muscle weakness (generalized): Secondary | ICD-10-CM

## 2017-12-12 DIAGNOSIS — M542 Cervicalgia: Secondary | ICD-10-CM

## 2017-12-12 DIAGNOSIS — M5412 Radiculopathy, cervical region: Secondary | ICD-10-CM

## 2017-12-12 DIAGNOSIS — R202 Paresthesia of skin: Principal | ICD-10-CM

## 2017-12-12 DIAGNOSIS — M79602 Pain in left arm: Principal | ICD-10-CM

## 2017-12-12 MED ORDER — GADOBENATE DIMEGLUMINE 529 MG/ML IV SOLN
13.0000 mL | Freq: Once | INTRAVENOUS | Status: AC | PRN
  Administered 2017-12-12: 13 mL via INTRAVENOUS

## 2017-12-14 ENCOUNTER — Ambulatory Visit: Payer: 59 | Admitting: Neurology

## 2017-12-14 ENCOUNTER — Encounter: Payer: Self-pay | Admitting: Neurology

## 2017-12-14 ENCOUNTER — Telehealth: Payer: Self-pay | Admitting: Neurology

## 2017-12-14 VITALS — BP 132/89 | HR 74 | Ht 62.0 in | Wt 145.0 lb

## 2017-12-14 DIAGNOSIS — R200 Anesthesia of skin: Secondary | ICD-10-CM | POA: Diagnosis not present

## 2017-12-14 DIAGNOSIS — H539 Unspecified visual disturbance: Secondary | ICD-10-CM

## 2017-12-14 DIAGNOSIS — M79601 Pain in right arm: Secondary | ICD-10-CM

## 2017-12-14 DIAGNOSIS — M7918 Myalgia, other site: Secondary | ICD-10-CM

## 2017-12-14 DIAGNOSIS — M6281 Muscle weakness (generalized): Secondary | ICD-10-CM

## 2017-12-14 DIAGNOSIS — R51 Headache with orthostatic component, not elsewhere classified: Secondary | ICD-10-CM

## 2017-12-14 DIAGNOSIS — M79602 Pain in left arm: Secondary | ICD-10-CM

## 2017-12-14 DIAGNOSIS — R531 Weakness: Secondary | ICD-10-CM

## 2017-12-14 DIAGNOSIS — M50122 Cervical disc disorder at C5-C6 level with radiculopathy: Secondary | ICD-10-CM | POA: Insufficient documentation

## 2017-12-14 DIAGNOSIS — R202 Paresthesia of skin: Secondary | ICD-10-CM

## 2017-12-14 NOTE — Progress Notes (Signed)
[facility]    Provider:  Dr Lucia Gaskins Referring Provider: Donita Brooks, MD Primary Care Physician:  Donita Brooks, MD  CC:  Paresthesias in the arms  HPI:  Meghan Lozano is a 57 y.o. female here as requested by Dr. Tanya Nones for . PMHx hypothyroidism, HLD, elevated LFTs .  She has chronic fatigue, a sensation of something in her throat and she had it worked up nothing significant thought to be anxiety, she had "mystery pain" on the right side of the torso and work up was unremarkable, mid august she started having pain in the shoulder and neck tightness and pins and needles, soreness of the muscles, then it progressed to pain in the shoulder, she had radicular symptoms into her fingers, severe pain so bad that it put her in bed, she has groin pain, her shins hurt and at night her feet throb.  She keeps grandchildren. She is losing her hair. Neck and arms symptoms are progressing, started earlier this year, activity makes it worse, nothing seems to help. No bowel or bladder changes, a lot of muscular neck pain. +blurry vision and lately headaches worse in the morning or with valsalva. She has a pain (points to the deltoid area at the location of the axillary dermatome) No other focal neurologic deficits, associated symptoms, inciting events or modifiable factors.  Reviewed notes, labs and imaging from outside physicians, which showed:  Personally reviewed images MRI cervical spine 9/24/209 and agree with the following:  IMPRESSION: 1. Multilevel cervical disc degeneration, most notable at C5-6 where there is mild-to-moderate bilateral neural foraminal stenosis. 2. Mild right neural foraminal stenosis at C3-4 and C6-7. 3. No spinal stenosis.   Review of Systems: Patient complains of symptoms per HPI as well as the following symptoms: headaches, insomnia, muscle pain, joint pain. Pertinent negatives and positives per HPI. All others negative.   Social History    Socioeconomic History  . Marital status: Married    Spouse name: Not on file  . Number of children: Not on file  . Years of education: Not on file  . Highest education level: Not on file  Occupational History  . Not on file  Social Needs  . Financial resource strain: Not on file  . Food insecurity:    Worry: Not on file    Inability: Not on file  . Transportation needs:    Medical: Not on file    Non-medical: Not on file  Tobacco Use  . Smoking status: Never Smoker  . Smokeless tobacco: Never Used  Substance and Sexual Activity  . Alcohol use: Yes    Alcohol/week: 1.0 standard drinks    Types: 1 Glasses of wine per week  . Drug use: No  . Sexual activity: Not on file    Comment: married to [employer],  Lifestyle  . Physical activity:    Days per week: Not on file    Minutes per session: Not on file  . Stress: Not on file  Relationships  . Social connections:    Talks on phone: Not on file    Gets together: Not on file    Attends religious service: Not on file    Active member of club or organization: Not on file    Attends meetings of clubs or organizations: Not on file    Relationship status: Not on file  . Intimate partner violence:    Fear of current or ex partner: Not on file    Emotionally abused: Not  on file    Physically abused: Not on file    Forced sexual activity: Not on file  Other Topics Concern  . Not on file  Social History Narrative  . Not on file    Family History  Problem Relation Age of Onset  . Heart disease Mother   . Diabetes Mother   . Skin cancer Mother   . Arthritis Mother   . Hypertension Mother   . Hyperlipidemia Mother   . Heart disease Father   . Arthritis Father   . Hypertension Father   . Hyperlipidemia Father   . Heart disease Paternal Uncle   . Stroke Maternal Grandmother   . Heart disease Maternal Grandfather   . Cerebral aneurysm Paternal Grandmother   . Heart disease Paternal Grandfather     Past Medical History:   Diagnosis Date  . Arthritis    bilateral hands and neck  . Elevated LFTs   . Hyperlipidemia   . Hypothyroidism   . Hypothyroidism     Past Surgical History:  Procedure Laterality Date  . FUNCTIONAL ENDOSCOPIC SINUS SURGERY  1981  . GANGLION CYST EXCISION Right 1975   wrist  . TONSILLECTOMY AND ADENOIDECTOMY  1966    Current Outpatient Medications  Medication Sig Dispense Refill  . SYNTHROID 100 MCG tablet TAKE 1 TABLET BY MOUTH DAILY BEFORE BREAKFAST. 90 tablet 1  . zolpidem (AMBIEN) 10 MG tablet TAKE 1 TABLET BY MOUTH AT BEDTIME AS NEEDED FOR INSOMNIA 30 tablet 3   No current facility-administered medications for this visit.     Allergies as of 12/14/2017 - Review Complete 12/14/2017  Allergen Reaction Noted  . Betadine [povidone iodine] Swelling 08/27/2011  . Demerol Nausea And Vomiting 09/23/2010  . Fish allergy Nausea And Vomiting 08/27/2011  . Shrimp [shellfish allergy] Nausea And Vomiting 08/27/2011    Vitals: BP 132/89   Pulse 74   Ht 5\' 2"  (1.575 m)   Wt 145 lb (65.8 kg)   BMI 26.52 kg/m  Last Weight:  Wt Readings from Last 1 Encounters:  12/14/17 145 lb (65.8 kg)   Last Height:   Ht Readings from Last 1 Encounters:  12/14/17 5\' 2"  (1.575 m)   Physical exam: Exam: Gen: NAD, conversant, well nourised, well groomed                     CV: RRR, no MRG. No Carotid Bruits. No peripheral edema, warm, nontender Eyes: Conjunctivae clear without exudates or hemorrhage  Neuro: Detailed Neurologic Exam  Speech:    Speech is normal; fluent and spontaneous with normal comprehension.  Cognition:    The patient is oriented to person, place, and time;     recent and remote memory intact;     language fluent;     normal attention, concentration,     fund of knowledge Cranial Nerves:    The pupils are equal, round, and reactive to light. The fundi are normal and spontaneous venous pulsations are present. Visual fields are full to finger confrontation.  Extraocular movements are intact. Trigeminal sensation is intact and the muscles of mastication are normal. The face is symmetric. The palate elevates in the midline. Hearing intact. Voice is normal. Shoulder shrug is normal. The tongue has normal motion without fasciculations.   Coordination:    Normal finger to nose and heel to shin. Normal rapid alternating movements.   Gait:    Heel-toe and tandem gait are normal.   Motor Observation:    No asymmetry,  no atrophy, and no involuntary movements noted. Tone:    Normal muscle tone.    Posture:    Posture is normal. normal erect    Strength: left deltoid 4+/5, left hip flexion 5-/5. Otherwise strength is V/V in the upper and lower limbs.      Sensation: intact to LT     Reflex Exam:  DTR's:    Absent Ajs, hypo patellars, upper deep tendon reflexes are normal bilaterally.   Toes:    The toes are downgoing bilaterally.   Clonus:    Clonus is absent.       Assessment/Plan:  57 y.o. female here as requested by Dr. Tanya Nones for upper extremity pain and paresthsias. PMHx hypothyroidism, HLD, elevated LFTs. She is here for evaluation mostly of neck pain and radiculopathy but also with other multiple neurologic and systemic symptoms.  - Patient has weakness left deltoid and pain in the area of the axillary dermatome which corresponds with a c5 foraminal stenosis seen on MRI of the cervical spine. - ESI at c5-c6 - will send to Dr. Ollen Bowl for long-term management but in the meantime (he is booked ou for several months) will send to [facility] imaging for initial ESI -  Emg/ncs bilateral uppers and left lower - Physical Therapy for at least several sessions for cervical myofascial exercises she can do at home - Also dry needling cervical muscles for tightness - Labs today - MRI brain w/wo contrast for left-sided weakness to rule out stroke or other pathology and to evaluate for MS given left-sided weakness and multiple neurologic  symptoms.  Orders Placed This Encounter  Procedures  . MR BRAIN W WO CONTRAST  . DG INJECT DIAG/THERA/INC NEEDLE/CATH/PLC EPI/CERV/THOR W/IMG  . B12 and Folate Panel  . Sjogren's syndrome antibods(ssa + ssb)  . Rheumatoid factor  . Heavy metals, blood  . Vitamin B6  . Vitamin B1  . Methylmalonic acid, serum  . Vitamin D, 25-hydroxy  . CK  . Ambulatory referral to Physical Therapy  . NCV with EMG(electromyography)        Naomie Dean, MD  [facility] [address] [place], [place] [address]  Phone [phone] Fax [phone]

## 2017-12-14 NOTE — Patient Instructions (Signed)
MRI brain w/wo contrast for left-sided weakness and to evaluate for MS Emg/ncs bilateral uppers and left lower Physical Therapy for at least several sessions for cervical myofascial exercises she can do at home Also dry needling cervical muscles for tightness ESI at c5-c6 - will send to Dr. Ollen Bowl (may send to [facility] in the meantime while waiting for Dr. Ollen Bowl) Labs today  Epidural Steroid Injection Patient Information  Description: The epidural space surrounds the nerves as they exit the spinal cord.  In some patients, the nerves can be compressed and inflamed by a bulging disc or a tight spinal canal (spinal stenosis).  By injecting steroids into the epidural space, we can bring irritated nerves into direct contact with a potentially helpful medication.  These steroids act directly on the irritated nerves and can reduce swelling and inflammation which often leads to decreased pain.  Epidural steroids may be injected anywhere along the spine and from the neck to the low back depending upon the location of your pain.   After numbing the skin with local anesthetic (like Novocaine), a small needle is passed into the epidural space slowly.  You may experience a sensation of pressure while this is being done.  The entire block usually last less than 10 minutes.  Conditions which may be treated by epidural steroids:   Low back and leg pain  Neck and arm pain  Spinal stenosis  Post-laminectomy syndrome  Herpes zoster (shingles) pain  Pain from compression fractures  Preparation for the injection:  1. Do not eat any solid food or dairy products within 8 hours of your appointment.  2. You may drink clear liquids up to 3 hours before appointment.  Clear liquids include water, black coffee, juice or soda.  No milk or cream please. 3. You may take your regular medication, including pain medications, with a sip of water before your appointment  Diabetics should hold regular  insulin (if taken separately) and take 1/2 normal NPH dos the morning of the procedure.  Carry some sugar containing items with you to your appointment. 4. A driver must accompany you and be prepared to drive you home after your procedure.  5. Bring all your current medications with your. 6. An IV may be inserted and sedation may be given at the discretion of the physician.   7. A blood pressure cuff, EKG and other monitors will often be applied during the procedure.  Some patients may need to have extra oxygen administered for a short period. 8. You will be asked to provide medical information, including your allergies, prior to the procedure.  We must know immediately if you are taking blood thinners (like Coumadin/Warfarin)  Or if you are allergic to IV iodine contrast (dye). We must know if you could possible be pregnant.  Possible side-effects:  Bleeding from needle site  Infection (rare, may require surgery)  Nerve injury (rare)  Numbness & tingling (temporary)  Difficulty urinating (rare, temporary)  Spinal headache ( a headache worse with upright posture)  Light -headedness (temporary)  Pain at injection site (several days)  Decreased blood pressure (temporary)  Weakness in arm/leg (temporary)  Pressure sensation in back/neck (temporary)  Call if you experience:  Fever/chills associated with headache or increased back/neck pain.  Headache worsened by an upright position.  New onset weakness or numbness of an extremity below the injection site  Hives or difficulty breathing (go to the emergency room)  Inflammation or drainage at the infection site  Severe back/neck pain  Any new symptoms which are concerning to you  Please note:  Although the local anesthetic injected can often make your back or neck feel good for several hours after the injection, the pain will likely return.  It takes 3-7 days for steroids to work in the epidural space.  You may not notice  any pain relief for at least that one week.  If effective, we will often do a series of three injections spaced 3-6 weeks apart to maximally decrease your pain.  After the initial series, we generally will wait several months before considering a repeat injection of the same type.  If you have any questions, please call [phone] [facility]  Trigger Point Dry Needling   What is Trigger Point Dry Needling (DN)?   1. DN is a physical therapy technique used to treat muscle pain and     Dysfunction.  Specifically, DN helps deactivate muscle trigger    points (Muscle Knots).   2. A thin filiform needle is used to penetrate the skin and stimulate    the underlying trigger point.  The goal is for a local twitch     response (LTR) to occur and for the trigger point to relax.  No    medication of any kind is injected during the procedure.   What Does Trigger Point Dry Needling Feel Like?   1. The procedures feels different for each individual patient.   Some    patients report that they do not actually feel the      needle enter the skin and overall the process is not  painful.  Very    mild bleeding may occur.  However, many patients feel a deep    cramping in the muscle in which the needle was inserted. This is t   he local twitch response.    How Will I Feel After The Treatment?   1. Soreness is normal, and the onset of soreness may not occur for    a few hours.  Typically this soreness does not last longer than two    days.   2. Bruising is uncommon, however; ice can be used to decrease any    possible bruising.   3. In rare cases feeling tired or nauseous after the treatment is    normal.  In addition, your symptoms may get worse before they    get better, this period will typically not last longer than 24 hours.   What Can I do After My Treatment?   1.  Increase your hydration by drinking more water for the next 24    hours.   2.  You may place ice or  heat on the areas treated that have become    sore, however don not use heat on inflamed or bruised areas.     Heat often brings more relief post needling.   3. You can continue your regular activities, but vigorous activity is    not recommended initially after the treatment for 24 hours.   4. DN is best combined with other physical therapy such as     strengthening, stretching, and other therapies.    Electromyoneurogram Electromyoneurogram is a test to check how well your muscles and nerves are working. This procedure includes the combined use of electromyogram (EMG) and nerve conduction study (NCS). EMG is used to look for muscular disorders. NCS, which is also called electroneurogram, measures how well your nerves are controlling your muscles. The  procedures are usually performed together to check if your muscles and nerves are healthy. If the reaction to testing is abnormal, this can indicate disease or injury, such as peripheral nerve damage. Tell a health care provider about:  Any allergies you have.  All medicines you are taking, including vitamins, herbs, eye drops, creams, and over-the-counter medicines.  Any problems you or family members have had with anesthetic medicines.  Any blood disorders you have.  Any surgeries you have had.  Any medical conditions you have.  Any pacemaker you have. What are the risks? Generally, this is a safe procedure. However, problems may occur, including:  Infection where the electrodes were inserted.  Bleeding.  What happens before the procedure?  Ask your health care provider about: ? Changing or stopping your regular medicines. This is especially important if you are taking diabetes medicines or blood thinners. ? Taking medicines such as aspirin and ibuprofen. These medicines can thin your blood. Do not take these medicines before your procedure if your health care provider instructs you not to.  Your health care provider may ask you to  avoid: ? Caffeine, such as coffee and tea. ? Nicotine. This includes cigarettes and anything with tobacco.  Do not use lotions or creams on the same day that you will be having the procedure. What happens during the procedure? For EMG:  Your health care provider will ask you to stay in a position so that he or she can access the muscle that will be studied. You may be standing, sitting down, or lying down.  You may be given a medicine that numbs the area (local anesthetic).  A very thin needle that has an electrode on it will be inserted into your muscle.  Another small electrode will be placed on your skin near the muscle.  Your health care provider will ask you to continue to remain still.  The electrodes will send a signal that tells about the electrical activity of your muscles. You may see this on a monitor or hear it in the room.  After your muscles have been studied at rest, your health care provider will ask you to contract or flex your muscles. The electrodes will send a signal that tells about the electrical activity of your muscles.  Your health care provider will remove the electrodes and the electrode needles when the procedure is finished. The procedure may vary among health care providers and hospitals. For NCS:  An electrode that records your nerve activity (recording electrode) will be placed on your skin by the muscle that is being studied.  An electrode that is used as a reference (reference electrode) will be placed near the recording electrode.  A paste or gel will be applied to your skin between the recording electrode and the reference electrode.  Your nerve will be stimulated with a mild shock. Your health care provider will measure how much time it takes for your muscle to react.  Your health care provider will remove the electrodes and the gel when the procedure is finished. The procedure may vary among health care providers and hospitals. What happens  after the procedure?  It is your responsibility to obtain your test results. Ask your health care provider or the department performing the test when and how you will get your results.  Your health care provider may: ? Give you medicines for any pain. ? Monitor the insertion sites to make sure that they stop bleeding. This information is not intended to replace  advice given to you by your health care provider. Make sure you discuss any questions you have with your health care provider. Document Released: 07/08/2004 Document Revised: 08/13/2015 Document Reviewed: 04/28/2014 Elsevier Interactive Patient Education  2018 [employer].

## 2017-12-14 NOTE — Telephone Encounter (Signed)
[employer] order sent to [facility]. They will obtain the auth and will reach out to the pt to schedule.  °

## 2017-12-20 LAB — VITAMIN B1: THIAMINE: 117.2 nmol/L (ref 66.5–200.0)

## 2017-12-20 LAB — HEAVY METALS, BLOOD
ARSENIC: 4 ug/L (ref 2–23)
LEAD, BLOOD: NOT DETECTED ug/dL (ref 0–4)
Mercury: NOT DETECTED ug/L (ref 0.0–14.9)

## 2017-12-20 LAB — CK: CK TOTAL: 140 U/L (ref 24–173)

## 2017-12-20 LAB — SJOGREN'S SYNDROME ANTIBODS(SSA + SSB)

## 2017-12-20 LAB — RHEUMATOID FACTOR

## 2017-12-20 LAB — VITAMIN B6: Vitamin B6: 10.3 ug/L (ref 2.0–32.8)

## 2017-12-20 LAB — VITAMIN D 25 HYDROXY (VIT D DEFICIENCY, FRACTURES): Vit D, 25-Hydroxy: 36.9 ng/mL (ref 30.0–100.0)

## 2017-12-20 LAB — METHYLMALONIC ACID, SERUM: Methylmalonic Acid: 129 nmol/L (ref 0–378)

## 2017-12-20 LAB — B12 AND FOLATE PANEL
FOLATE: 9.7 ng/mL (ref >3.0)
VITAMIN B 12: 596 pg/mL (ref 232–1245)

## 2017-12-22 ENCOUNTER — Ambulatory Visit
Admission: RE | Admit: 2017-12-22 | Discharge: 2017-12-22 | Disposition: A | Discharge status: Home / Self Care | Payer: 59 | Source: Ambulatory Visit | Attending: Neurology | Admitting: Neurology

## 2017-12-22 ENCOUNTER — Encounter: Payer: Self-pay | Admitting: Physical Therapy

## 2017-12-22 ENCOUNTER — Other Ambulatory Visit: Payer: Self-pay

## 2017-12-22 ENCOUNTER — Ambulatory Visit: Payer: 59 | Attending: Neurology | Admitting: Physical Therapy

## 2017-12-22 DIAGNOSIS — R51 Headache with orthostatic component, not elsewhere classified: Secondary | ICD-10-CM

## 2017-12-22 DIAGNOSIS — M6281 Muscle weakness (generalized): Secondary | ICD-10-CM

## 2017-12-22 DIAGNOSIS — M542 Cervicalgia: Secondary | ICD-10-CM | POA: Diagnosis present

## 2017-12-22 DIAGNOSIS — M79602 Pain in left arm: Secondary | ICD-10-CM

## 2017-12-22 DIAGNOSIS — M5412 Radiculopathy, cervical region: Secondary | ICD-10-CM | POA: Insufficient documentation

## 2017-12-22 DIAGNOSIS — R200 Anesthesia of skin: Secondary | ICD-10-CM

## 2017-12-22 DIAGNOSIS — R531 Weakness: Secondary | ICD-10-CM

## 2017-12-22 DIAGNOSIS — R202 Paresthesia of skin: Secondary | ICD-10-CM

## 2017-12-22 DIAGNOSIS — M79601 Pain in right arm: Secondary | ICD-10-CM

## 2017-12-22 DIAGNOSIS — H539 Unspecified visual disturbance: Secondary | ICD-10-CM

## 2017-12-22 MED ORDER — GADOBENATE DIMEGLUMINE 529 MG/ML IV SOLN
13.0000 mL | Freq: Once | INTRAVENOUS | Status: AC | PRN
  Administered 2017-12-22: 13 mL via INTRAVENOUS

## 2017-12-22 NOTE — Patient Instructions (Signed)

## 2017-12-22 NOTE — Therapy (Addendum)
[facility] [address] [place], [place], [address] Phone: [phone]   Fax:  [phone]  Physical Therapy Evaluation  Patient Details  Name: Meghan Lozano MRN: [id number] Date of Birth: [birth year] Referring Provider (PT): Naomie Dean, MD   Encounter Date: 12/22/2017  PT End of Session - 12/22/17 1001    Visit Number  1    Number of Visits  4    Date for PT Re-Evaluation  01/19/18    Authorization Type  Aetna    PT Start Time  [phone]    PT Stop Time  0932    PT Time Calculation (min)  53 min    Activity Tolerance  Patient tolerated treatment well    Behavior During Therapy  [facility] for tasks assessed/performed       Past Medical History:  Diagnosis Date  . Arthritis    bilateral hands and neck  . Elevated LFTs   . Hyperlipidemia   . Hypothyroidism   . Hypothyroidism     Past Surgical History:  Procedure Laterality Date  . FUNCTIONAL ENDOSCOPIC SINUS SURGERY  1981  . GANGLION CYST EXCISION Right 1975   wrist  . TONSILLECTOMY AND ADENOIDECTOMY  1966    There were no vitals filed for this visit.   Subjective Assessment - 12/22/17 0948    Pertinent History  Pt. reports original insidious onset of neck and bilateral upper trapezius region pain around the 2nd week of August of this year. Pt. began noting pain radiating into left shoulder region as well. Pt. also noting development of more diffuse pain symptoms including pain in groin, bilateral legs and feet with diffuse parasthesias and intermittent blurred vision. Pt. had cervical MRI 12-12-17 which per report showed mild-moderate stenosis at C5-6 region which is suspected as correlating with left UE symptoms but is also pending brain MRI this evening to investigate for further neuro pathology. Pt. is also pending EMG/NCS as well as C5-6 ESI.          [facility] PT Assessment - 12/22/17 0001      Assessment   Medical Diagnosis  cervical radiculopathy, myofascial pain     Referring Provider (PT)  Naomie Dean, MD    Onset Date/Surgical Date  11/01/17   estimated per report onset symptoms the 2nd week of August   Hand Dominance  Right    Next MD Visit  01/11/2018    Prior Therapy  none      Precautions   Precautions  None      Restrictions   Weight Bearing Restrictions  No      Balance Screen   Has the patient fallen in the past 6 months  No      Home Environment   Living Environment  Private residence    Living Arrangements  Spouse/significant other      Prior Function   Level of Independence  Independent with basic ADLs   Independent with      Cognition   Overall Cognitive Status  Within Functional Limits for tasks assessed      Observation/Other Assessments   Focus on Therapeutic Outcomes (FOTO)   53      Sensation   Light Touch  Appears Intact      Posture/Postural Control   Posture/Postural Control  --   mild forward head     ROM / Strength   AROM / PROM / Strength  AROM;Strength      AROM   AROM Assessment Site  Cervical;Shoulder    Right/Left Shoulder  Right;Left    Right Shoulder Flexion  160 Degrees    Right Shoulder ABduction  170 Degrees    Right Shoulder Internal Rotation  --   reach to T7   Right Shoulder External Rotation  80 Degrees   at 0 deg abd   Left Shoulder Flexion  160 Degrees    Left Shoulder ABduction  170 Degrees    Left Shoulder Internal Rotation  --   reach to T8   Left Shoulder External Rotation  70 Degrees   at 0 deg abd   Cervical Flexion  40    Cervical Extension  38    Cervical - Right Side Bend  30    Cervical - Left Side Bend  28    Cervical - Right Rotation  80    Cervical - Left Rotation  60      Strength   Strength Assessment Site  Shoulder;Elbow;Wrist;Hand    Right/Left Shoulder  Right;Left    Right Shoulder Flexion  5/5    Right Shoulder ABduction  5/5    Right Shoulder Internal Rotation  5/5    Right Shoulder External Rotation  5/5    Left Shoulder Flexion  5/5    Left  Shoulder Internal Rotation  5/5    Left Shoulder External Rotation  5/5    Right/Left Elbow  Right;Left    Right Elbow Flexion  5/5    Right Elbow Extension  5/5    Left Elbow Flexion  5/5    Left Elbow Extension  5/5    Right/Left Wrist  Right;Left    Right Wrist Flexion  5/5    Right Wrist Extension  5/5    Left Wrist Flexion  4/5    Left Wrist Extension  4+/5    Right/Left hand  Right;Left    Right Hand Grip (lbs)  52    Left Hand Grip (lbs)  32      Palpation   Palpation comment  Tightness and tenderness to palpation left>right upper trapezius, levator, cervical paraspinals      Special Tests    Special Tests  Cervical;Rotator Cuff Impingement    Cervical Tests  Spurling's    Rotator Cuff Impingment tests  Neer impingement test;Empty Can test      Spurling's   Findings  Negative    Side  Left    Comment  negative bilaterally      Neer Impingement test    Findings  Negative    Side  Left      Empty Can test   Findings  Positive    Side  Left                Objective measurements completed on examination: See above findings.      [facility] Adult PT Treatment/Exercise - 12/22/17 0001      Exercises   Exercises  Neck;Shoulder      Neck Exercises: Seated   Neck Retraction  15 reps    Other Seated Exercise  self upper trapezius and levator stretches 2x30 sec ea. bilat    Other Seated Exercise  cervical flexion AROM x 15 reps      Shoulder Exercises: Seated   Other Seated Exercises  scapular retraction x 15 reps       Trigger Point Dry Needling - 12/22/17 1122    Consent Given?  Yes    Education Handout Provided  Yes    Muscles Treated  Upper Body  Upper trapezius   bilat.   Upper Trapezius Response  Twitch reponse elicited           PT Education - 12/22/17 1000    Education Details  spinal anatomy with stenosis and potential etiology symptoms, posture, HEP, dry needling including risks, potential benefits-pt. consents to treatment     Person(s) Educated  Patient    Methods  Explanation;Demonstration;Verbal cues    Comprehension  Verbalized understanding;Returned demonstration       PT Short Term Goals - 12/22/17 1135      PT SHORT TERM GOAL #2   Title  Return postural demos to decrease cervical tension    Time  2    Period  Weeks    Status  New        PT Long Term Goals - 12/22/17 1132      PT LONG TERM GOAL #1   Title  Increase left cervical rotation AROM 10 deg or greater to improve ability to turn head while driving    Baseline  60 deg    Time  4    Period  Weeks    Status  New      PT LONG TERM GOAL #2   Title  Left wrist strength 5/5 and left grip strength increased 5-10 lbs. ot improve ability for hand/wrist use for chores, opening jars    Baseline  wrist flex 4/5, ext 4+/5, grip 32 lbs.    Time  4    Period  Weeks      PT LONG TERM GOAL #3   Title  Tolerate sitting for car travel, eating meals, reading periods of at least 30 min with pain 3/10 or less    Baseline  7/10 pain    Time  4    Period  Weeks    Status  New             Plan - 12/22/17 1137    Clinical Decision Making  Moderate      Plan - 12/28/17 1202    Clinical Impression Statement  Pt. presents with cervical and bilateral upper trapezius region pain with additional left UE radiating symptoms. Clinical presentation consistent with myofascial pain with left UE radiating symptoms potentially associated with radiculopathy from underlying stenosis. Pt. additionally presents with other diffuse parasthesias and pain symptoms, issues such as blurred visoin pending further workup with brain MRI and NCS/EMG. Pt. would benefit from PT to address current associated functional limitations.    History and Personal Factors relevant to plan of care:  duration symptoms, potential radicular etiology    Clinical Presentation  Evolving    Clinical Presentation due to:  diffuse symptoms and potential for neuro diagnosis contributing to current  symptoms    Clinical Decision Making  Moderate    Rehab Potential  Good    Clinical Impairments Affecting Rehab Potential  Good potential to address postural/myofascial symptoms but still pending further diagnostics    PT Frequency  1x / week    PT Duration  4 weeks    PT Treatment/Interventions  Moist Heat;Traction;Ultrasound;Electrical Stimulation;Neuromuscular re-education;Therapeutic activities;Patient/family education;Therapeutic exercise;Manual techniques;Dry needling    PT Next Visit Plan  Review HEP as needed, check response dry needling from eval and continue as found beneficial, STM, trial gentle manual traction, continue stretches and flexion bias cervical ROM, modalities prn for pain    PT Home Exercise Plan  cervical and scapular retractions, upper trapezius and levator stretches, cervical flexion  Consulted and Agree with Plan of Care  Patient       Patient will benefit from skilled therapeutic intervention in order to improve the following deficits and impairments:  Pain, Postural dysfunction, Increased muscle spasms, Decreased activity tolerance, Decreased range of motion  Visit Diagnosis: Cervical radiculopathy  Neck pain     Problem List Patient Active Problem List   Diagnosis Date Noted  . Cervical disc disorder at C5-C6 level with radiculopathy 12/14/2017  . GERD (gastroesophageal reflux disease) 09/23/2015  . Elevated LFTs   . Hypothyroidism     Ayesha Mohair, PT, DPT 12/22/2017, 11:40 AM  [facility] [address] [place], [place], [address] Phone: [phone]   Fax:  [phone]  Name: ALVIE SPELTZ MRN: [id number] Date of Birth: 06/05/1960

## 2018-01-05 ENCOUNTER — Encounter: Payer: Self-pay | Admitting: Physical Therapy

## 2018-01-05 ENCOUNTER — Ambulatory Visit: Payer: 59 | Admitting: Physical Therapy

## 2018-01-05 DIAGNOSIS — M542 Cervicalgia: Secondary | ICD-10-CM

## 2018-01-05 DIAGNOSIS — M5412 Radiculopathy, cervical region: Secondary | ICD-10-CM

## 2018-01-05 NOTE — Therapy (Signed)
[facility] [address] [place], [place], [address] Phone: [phone]   Fax:  [phone]  Physical Therapy Treatment  Patient Details  Name: Meghan Lozano MRN: [id number] Date of Birth: 1960-05-14 Referring Provider (PT): Naomie Dean, MD   Encounter Date: 01/05/2018  PT End of Session - 01/05/18 1027    Visit Number  2    Number of Visits  4    Date for PT Re-Evaluation  01/19/18    Authorization Type  Aetna    PT Start Time  [phone]    PT Stop Time  1027    PT Time Calculation (min)  58 min    Activity Tolerance  Patient tolerated treatment well    Behavior During Therapy  [facility] for tasks assessed/performed       Past Medical History:  Diagnosis Date  . Arthritis    bilateral hands and neck  . Elevated LFTs   . Hyperlipidemia   . Hypothyroidism   . Hypothyroidism     Past Surgical History:  Procedure Laterality Date  . FUNCTIONAL ENDOSCOPIC SINUS SURGERY  1981  . GANGLION CYST EXCISION Right 1975   wrist  . TONSILLECTOMY AND ADENOIDECTOMY  1966    There were no vitals filed for this visit.  Subjective Assessment - 01/05/18 1016    Subjective  Pt. reports was sore after last tx. but then noted some benefit for decreased upper trap region pain though pain/tightness has return. She continues with radiating pain from neck to left proximal arm. Pt. had brain MRI (-) findings/pathology to correlate with symptoms. Still pending ESI and EMG/NCS.    Currently in Pain?  Yes    Pain Score  7     Pain Location  Neck    Pain Orientation  Left    Pain Descriptors / Indicators  Aching;Sharp    Pain Radiating Towards  Left shoulder and proximal arm    Pain Onset  More than a month ago    Aggravating Factors   sitting, reading    Pain Relieving Factors  no eases noted    Effect of Pain on Daily Activities  limitations for ADLs/IADLs and positional tolerance    Multiple Pain Sites  No                        [facility] Adult PT Treatment/Exercise - 01/05/18 0001      Exercises   Exercises  Neck      Neck Exercises: Supine   Neck Retraction  15 reps    Capital Flexion  --   supine retraction to assisted flexion 1x15     Manual Therapy   Manual Therapy  Soft tissue mobilization;Myofascial release;Manual Traction    Soft tissue mobilization  --   STM bilat. upper traps and levator left side focus   Myofascial Release  suboccipital release    Manual Traction  Cervical manual traction      Neck Exercises: Stretches   Upper Trapezius Stretch  3 reps;30 seconds   left side manual stretch   Levator Stretch  3 reps;30 seconds   left side manual stretch      Trigger Point Dry Needling - 01/05/18 1022    Consent Given?  Yes    Muscles Treated Upper Body  Upper trapezius;Levator scapulae   bilat. upper trapezius, left levator and middle deltoid   Upper Trapezius Response  Twitch reponse elicited  PT Education - 01/05/18 1027    Education Details  POC, etilogy stenosis, differentiation myofascial vs. potential radicular symptoms    Person(s) Educated  Patient    Methods  Explanation    Comprehension  Verbalized understanding       PT Short Term Goals - 12/22/17 1135      PT SHORT TERM GOAL #2   Title  Return postural demos to decrease cervical tension    Time  2    Period  Weeks    Status  New        PT Long Term Goals - 12/22/17 1132      PT LONG TERM GOAL #1   Title  Increase left cervical rotation AROM 10 deg or greater to improve ability to turn head while driving    Baseline  60 deg    Time  4    Period  Weeks    Status  New      PT LONG TERM GOAL #2   Title  Left wrist strength 5/5 and left grip strength increased 5-10 lbs. ot improve ability for hand/wrist use for chores, opening jars    Baseline  wrist flex 4/5, ext 4+/5, grip 32 lbs.    Time  4    Period  Weeks      PT LONG TERM GOAL #3   Title  Tolerate sitting  for car travel, eating meals, reading periods of at least 30 min with pain 3/10 or less    Baseline  7/10 pain    Time  4    Period  Weeks    Status  New            Plan - 01/05/18 1028    Clinical Impression Statement  No neuro pathology per brain MRI. Good response for myofascial pain from tx. at eval, continues with potential radicular symptoms LUE-traction and flexion bias ROM due to underlying stenosis to address. Responds well during tx. for symptom relief but will await further status for how long relief lasts.    PT Frequency  1x / week    PT Duration  4 weeks    PT Treatment/Interventions  Moist Heat;Traction;Ultrasound;Electrical Stimulation;Neuromuscular re-education;Therapeutic activities;Patient/family education;Therapeutic exercise;Manual techniques;Dry needling    PT Next Visit Plan  review HEP if needed, manual traction, flexion bias ROM, STM, dry needling, modalities prn    PT Home Exercise Plan  cervical and scapular retractions, upper trapezius and levator stretches, cervical flexion    Consulted and Agree with Plan of Care  Patient       Patient will benefit from skilled therapeutic intervention in order to improve the following deficits and impairments:  Pain, Postural dysfunction, Increased muscle spasms, Decreased activity tolerance, Decreased range of motion  Visit Diagnosis: Neck pain  Cervical radiculopathy     Problem List Patient Active Problem List   Diagnosis Date Noted  . Cervical disc disorder at C5-C6 level with radiculopathy 12/14/2017  . GERD (gastroesophageal reflux disease) 09/23/2015  . Elevated LFTs   . Hypothyroidism     Lazarus Gowda, PT, DPT 01/05/18 10:32 AM  [facility] [address] [place], [place], [address] Phone: [phone]   Fax:  [phone]  Name: Meghan Lozano MRN: [id number] Date of Birth: 01-Jul-1960

## 2018-01-11 ENCOUNTER — Ambulatory Visit (INDEPENDENT_AMBULATORY_CARE_PROVIDER_SITE_OTHER): Payer: 59 | Admitting: Neurology

## 2018-01-11 DIAGNOSIS — M79601 Pain in right arm: Secondary | ICD-10-CM | POA: Diagnosis not present

## 2018-01-11 DIAGNOSIS — G5601 Carpal tunnel syndrome, right upper limb: Secondary | ICD-10-CM

## 2018-01-11 DIAGNOSIS — Z0289 Encounter for other administrative examinations: Secondary | ICD-10-CM

## 2018-01-11 DIAGNOSIS — R202 Paresthesia of skin: Secondary | ICD-10-CM

## 2018-01-11 DIAGNOSIS — M5412 Radiculopathy, cervical region: Secondary | ICD-10-CM | POA: Diagnosis not present

## 2018-01-11 DIAGNOSIS — M79602 Pain in left arm: Secondary | ICD-10-CM

## 2018-01-11 NOTE — Progress Notes (Signed)
See procedure note.

## 2018-01-11 NOTE — Progress Notes (Signed)
Full Name: Meghan Lozano Gender: Female MRN #: [id number] Date of Birth: 08/28/2060    Visit Date: 01/11/18 09:46 Age: 57 Years 7 Months Old Examining Physician: Naomie Dean, MD  Referring Physician: Donita Brooks, MD  History: 57 y.o. female here as requested by Dr. Tanya Nones for upper extremity pain and paresthsias.  Patient has weakness left deltoid and pain in the area of the axillary dermatome which corresponds with a c5 foraminal stenosis seen on MRI of the cervical spine. Suspect left arm radiculopathy and possibly right hand CTS to explain symptoms.  Summary:   The right median/ulnar (palm) comparison nerve showed prolonged distal peak latency (Median Palm, 3.4 ms, N<2.2) and abnormal peak latency difference (Median Palm-Ulnar Palm, 1.2 ms, N<0.4) with a relative median delay.    All remaining nerves were within normal limits All muscles were within normal limits   Conclusion:  1. There is mild right Carpal Tunnel Syndrome 2. There was no acute/ongoing denervation on EMG to indicate left-sided radiculopathy however given the left Axillary motor decrease in amplitude as compared to right, along with the clinical and radiologic findings, suggest left-sided C5 radiculopathy.  Naomie Dean, MD  [facility] [address] [place], [place] [address]  Phone [phone] Fax [phone]     Conclusion:   Naomie Dean M.D.  [facility] [address] [place], [place] [address] Tel: [phone] Fax: [phone]        [facility]    Nerve / Sites Muscle Latency Ref. Amplitude Ref. Rel Amp Segments Distance Velocity Ref. Area    ms ms mV mV %  cm m/s m/s mVms  R Median - APB     Wrist APB 4.3 ?4.4 6.2 ?4.0 100 Wrist - APB 7   21.6     Upper arm APB 7.9  5.8  93.5 Upper arm - Wrist 20 56 ?49 20.7  L Median - APB     Wrist APB 3.1 ?4.4 7.2 ?4.0 100 Wrist - APB 7   22.6     Upper arm APB 6.5  7.2  100 Upper arm - Wrist  19 55 ?49 21.7  R Ulnar - ADM     Wrist ADM 2.9 ?3.3 8.6 ?6.0 100 Wrist - ADM 7   28.5     B.Elbow ADM 5.7  8.4  97.8 B.Elbow - Wrist 18 63 ?49 28.4     A.Elbow ADM 7.4  8.3  98.2 A.Elbow - B.Elbow 10 58 ?49 27.8         A.Elbow - Wrist      L Ulnar - ADM     Wrist ADM 2.8 ?3.3 8.6 ?6.0 100 Wrist - ADM 7   29.3     B.Elbow ADM 5.5  8.4  98.1 B.Elbow - Wrist 18 66 ?49 28.0     A.Elbow ADM 7.0  8.2  97.2 A.Elbow - B.Elbow 10 66 ?49 28.3         A.Elbow - Wrist      L Peroneal - EDB     Ankle EDB 4.2 ?6.5 6.1 ?2.0 100 Ankle - EDB 9   18.7     Fib head EDB 9.3  5.7  94 Fib head - Ankle 29 57 ?44 18.1     Pop fossa EDB 11.1  5.8  101 Pop fossa - Fib head 10 55 ?44 17.7         Pop fossa - Ankle  L Tibial - AH     Ankle AH 3.6 ?5.8 4.6 ?4.0 100 Ankle - AH 9   7.4     Pop fossa AH 11.2  4.0  87.2 Pop fossa - Ankle 35 46 ?41 6.7  R Axillary - Deltoid     2 Deltoid 3.2  18.3       102.2  L Axillary - Deltoid     2 Deltoid 3.2  15.7       90.4                       SNC    Nerve / Sites Rec. Site Peak Lat Ref.  Amp Ref. Segments Distance Peak Diff Ref.    ms ms V V  cm ms ms  L Sural - Ankle (Calf)     Calf Ankle 3.3 ?4.4 10 ?6 Calf - Ankle 14    L Superficial peroneal - Ankle     Lat leg Ankle 3.6 ?4.4 7 ?6 Lat leg - Ankle 14    R Median, Ulnar - Transcarpal comparison     Median Palm Wrist 3.4 ?2.2 6 ?35 Median Palm - Wrist 8       Ulnar Palm Wrist 2.2 ?2.2 5 ?12 Ulnar Palm - Wrist 8          Median Palm - Ulnar Palm  1.2 ?0.4  L Median, Ulnar - Transcarpal comparison     Median Palm Wrist 2.3 ?2.2 18 ?35 Median Palm - Wrist 8       Ulnar Palm Wrist 2.1 ?2.2 15 ?12 Ulnar Palm - Wrist 8          Median Palm - Ulnar Palm  0.2 ?0.4  R Median - Orthodromic (Dig II, Mid palm)     Dig II Wrist 4.3 ?3.4 5 ?10 Dig II - Wrist 13    L Median - Orthodromic (Dig II, Mid palm)     Dig II Wrist 3.2 ?3.4 11 ?10 Dig II - Wrist 13    R Ulnar - Orthodromic, (Dig V, Mid palm)     Dig V Wrist  2.6 ?3.1 5 ?5 Dig V - Wrist 11    L Ulnar - Orthodromic, (Dig V, Mid palm)     Dig V Wrist 2.6 ?3.1 6 ?5 Dig V - Wrist 63                        F  Wave    Nerve F Lat Ref.   ms ms  R Ulnar - ADM 24.8 ?32.0  L Ulnar - ADM 24.0 ?32.0  L Tibial - AH 42.1 ?56.0           EMG full       EMG Summary Table    Spontaneous MUAP Recruitment  Muscle IA Fib PSW Fasc Other Amp Dur. Poly Pattern  Bilat. Deltoid Normal None None None _______ Normal Normal Normal Normal  Bilat. Triceps brachii Normal None None None _______ Normal Normal Normal Normal  Bilat. Pronator teres Normal None None None _______ Normal Normal Normal Normal  Bilat. First dorsal interosseous Normal None None None _______ Normal Normal Normal Normal  Bilat. Opponens pollicis Normal None None None _______ Normal Normal Normal Normal  Bilat. Cervical paraspinals (low) Normal None None None _______ Normal Normal Normal Normal

## 2018-01-15 NOTE — Addendum Note (Signed)
Addended by: Naomie Dean B on: 01/15/2018 12:18 PM   Modules accepted: Orders, Level of Service

## 2018-01-15 NOTE — Progress Notes (Addendum)
History: 57 y.o. female here as requested by Dr. Tanya Nones for upper extremity pain and paresthsias.  Patient has weakness left deltoid and pain in the area of the axillary dermatome which corresponds with a c5 foraminal stenosis seen on MRI of the cervical spine. Suspect left arm radiculopathy and possibly right hand CTS to explain symptoms.  Discussed finding on emg/ncs. Right CTS, discussed etiology, anatomy, and choices for therapy. We did not find conclusive evidence for left radiculopathy but given the left Axillary motor decrease in amplitude as compared to right, along with the clinical and radiologic findings, this all suggests left-sided C5 radiculopathy.  She would like to see Dr. Amanda Pea for the right CTS as well as the left cervical radiculopathy for surgical or other options. Also discussed ESI for the left cervical radiculopathy, discuss with Dr. Amanda Pea.  Orders Placed This Encounter  Procedures  . Ambulatory referral to Orthopedic Surgery     A total of  1. Carpal tunnel syndrome of right wrist   2. Cervical radiculopathy   3. Paresthesia   4. Paresthesia and pain of both upper extremities     15 minutes was spent face-to-face with this patient. Over half this time was spent on counseling patient on the above diagnosis and different diagnostic and therapeutic options, counseling and coordination of care, risks ans benefits of management, compliance, or risk factor reduction and education.  This does not include time spent on emg/ncs.

## 2018-01-15 NOTE — Procedures (Signed)
     Full Name: Meghan Lozano Gender: Female MRN #: [id number] Date of Birth: 11/05/1960    Visit Date: 01/11/18 09:46 Age: 57 Years 7 Months Old Examining Physician: Eudell Julian, MD  Referring Physician: Warren T Pickard, MD  History: 57 y.o. female here as requested by Dr. Pickard for upper extremity pain and paresthsias.  Patient has weakness left deltoid and pain in the area of the axillary dermatome which corresponds with a c5 foraminal stenosis seen on MRI of the cervical spine. Suspect left arm radiculopathy and possibly right hand CTS to explain symptoms.  Summary:   The right median/ulnar (palm) comparison nerve showed prolonged distal peak latency (Median Palm, 3.4 ms, N<2.2) and abnormal peak latency difference (Median Palm-Ulnar Palm, 1.2 ms, N<0.4) with a relative median delay.    All remaining nerves were within normal limits All muscles were within normal limits   Conclusion:  1. There is mild right Carpal Tunnel Syndrome 2. There was no acute/ongoing denervation on EMG to indicate left-sided radiculopathy however given the left Axillary motor decrease in amplitude as compared to right, along with the clinical and radiologic findings, suggest left-sided C5 radiculopathy.  Detroit Frieden, MD  [facility] [address] [place], [place] [address]  Phone [phone] Fax [phone]     Conclusion:   [name] M.D.  [facility] [address] , [place] [address] Tel: [phone] Fax: [phone]        MNC    Nerve / Sites Muscle Latency Ref. Amplitude Ref. Rel Amp Segments Distance Velocity Ref. Area    ms ms mV mV %  cm m/s m/s mVms  R Median - APB     Wrist APB 4.3 ?4.4 6.2 ?4.0 100 Wrist - APB 7   21.6     Upper arm APB 7.9  5.8  93.5 Upper arm - Wrist 20 56 ?49 20.7  L Median - APB     Wrist APB 3.1 ?4.4 7.2 ?4.0 100 Wrist - APB 7   22.6     Upper arm APB 6.5  7.2  100 Upper arm - Wrist  19 55 ?49 21.7  R Ulnar - ADM     Wrist ADM 2.9 ?3.3 8.6 ?6.0 100 Wrist - ADM 7   28.5     B.Elbow ADM 5.7  8.4  97.8 B.Elbow - Wrist 18 63 ?49 28.4     A.Elbow ADM 7.4  8.3  98.2 A.Elbow - B.Elbow 10 58 ?49 27.8         A.Elbow - Wrist      L Ulnar - ADM     Wrist ADM 2.8 ?3.3 8.6 ?6.0 100 Wrist - ADM 7   29.3     B.Elbow ADM 5.5  8.4  98.1 B.Elbow - Wrist 18 66 ?49 28.0     A.Elbow ADM 7.0  8.2  97.2 A.Elbow - B.Elbow 10 66 ?49 28.3         A.Elbow - Wrist      L Peroneal - EDB     Ankle EDB 4.2 ?6.5 6.1 ?2.0 100 Ankle - EDB 9   18.7     Fib head EDB 9.3  5.7  94 Fib head - Ankle 29 57 ?44 18.1     Pop fossa EDB 11.1  5.8  101 Pop fossa - Fib head 10 55 ?44 17.7         Pop fossa - Ankle        L Tibial - AH     Ankle AH 3.6 ?5.8 4.6 ?4.0 100 Ankle - AH 9   7.4     Pop fossa AH 11.2  4.0  87.2 Pop fossa - Ankle 35 46 ?41 6.7  R Axillary - Deltoid     2 Deltoid 3.2  18.3       102.2  L Axillary - Deltoid     2 Deltoid 3.2  15.7       90.4                       SNC    Nerve / Sites Rec. Site Peak Lat Ref.  Amp Ref. Segments Distance Peak Diff Ref.    ms ms V V  cm ms ms  L Sural - Ankle (Calf)     Calf Ankle 3.3 ?4.4 10 ?6 Calf - Ankle 14    L Superficial peroneal - Ankle     Lat leg Ankle 3.6 ?4.4 7 ?6 Lat leg - Ankle 14    R Median, Ulnar - Transcarpal comparison     Median Palm Wrist 3.4 ?2.2 6 ?35 Median Palm - Wrist 8       Ulnar Palm Wrist 2.2 ?2.2 5 ?12 Ulnar Palm - Wrist 8          Median Palm - Ulnar Palm  1.2 ?0.4  L Median, Ulnar - Transcarpal comparison     Median Palm Wrist 2.3 ?2.2 18 ?35 Median Palm - Wrist 8       Ulnar Palm Wrist 2.1 ?2.2 15 ?12 Ulnar Palm - Wrist 8          Median Palm - Ulnar Palm  0.2 ?0.4  R Median - Orthodromic (Dig II, Mid palm)     Dig II Wrist 4.3 ?3.4 5 ?10 Dig II - Wrist 13    L Median - Orthodromic (Dig II, Mid palm)     Dig II Wrist 3.2 ?3.4 11 ?10 Dig II - Wrist 13    R Ulnar - Orthodromic, (Dig V, Mid palm)     Dig V Wrist  2.6 ?3.1 5 ?5 Dig V - Wrist 11    L Ulnar - Orthodromic, (Dig V, Mid palm)     Dig V Wrist 2.6 ?3.1 6 ?5 Dig V - Wrist 11                        F  Wave    Nerve F Lat Ref.   ms ms  R Ulnar - ADM 24.8 ?32.0  L Ulnar - ADM 24.0 ?32.0  L Tibial - AH 42.1 ?56.0           EMG full       EMG Summary Table    Spontaneous MUAP Recruitment  Muscle IA Fib PSW Fasc Other Amp Dur. Poly Pattern  Bilat. Deltoid Normal None None None _______ Normal Normal Normal Normal  Bilat. Triceps brachii Normal None None None _______ Normal Normal Normal Normal  Bilat. Pronator teres Normal None None None _______ Normal Normal Normal Normal  Bilat. First dorsal interosseous Normal None None None _______ Normal Normal Normal Normal  Bilat. Opponens pollicis Normal None None None _______ Normal Normal Normal Normal  Bilat. Cervical paraspinals (low) Normal None None None _______ Normal Normal Normal Normal      

## 2018-01-17 ENCOUNTER — Telehealth: Payer: Self-pay | Admitting: Neurology

## 2018-01-17 NOTE — Telephone Encounter (Signed)
Per Dr. Beckey Downing office Dr. Beckey Downing office 1 st  Available is Jan. Patient want's to wait for Dr.Gramig until Jan. Telephone [phone]- [phone].

## 2018-01-19 ENCOUNTER — Ambulatory Visit
Admission: RE | Admit: 2018-01-19 | Discharge: 2018-01-19 | Disposition: A | Discharge status: Home / Self Care | Payer: 59 | Source: Ambulatory Visit | Attending: Neurology | Admitting: Neurology

## 2018-01-19 ENCOUNTER — Ambulatory Visit: Payer: 59 | Admitting: Physical Therapy

## 2018-01-19 DIAGNOSIS — M50122 Cervical disc disorder at C5-C6 level with radiculopathy: Secondary | ICD-10-CM

## 2018-01-19 MED ORDER — IOPAMIDOL (ISOVUE-M 300) INJECTION 61%
1.0000 mL | Freq: Once | INTRAMUSCULAR | Status: AC | PRN
  Administered 2018-01-19: 1 mL via EPIDURAL

## 2018-01-19 MED ORDER — TRIAMCINOLONE ACETONIDE 40 MG/ML IJ SUSP (RADIOLOGY)
60.0000 mg | Freq: Once | INTRAMUSCULAR | Status: AC
  Administered 2018-01-19: 60 mg via EPIDURAL

## 2018-01-19 NOTE — Discharge Instructions (Signed)

## 2018-01-22 ENCOUNTER — Ambulatory Visit: Payer: 59 | Admitting: Neurology

## 2018-01-22 ENCOUNTER — Encounter

## 2018-02-02 ENCOUNTER — Other Ambulatory Visit: Payer: 59

## 2018-02-27 ENCOUNTER — Telehealth: Payer: Self-pay | Admitting: Physical Therapy

## 2018-02-27 NOTE — Telephone Encounter (Signed)
Patient has not been seen in >30 days for therapy-called patient and left message to check on status/see if ready for d/c.

## 2018-03-01 NOTE — Therapy (Signed)
[place], [place], [address] Phone: [phone]   Fax:  [phone]  Physical Therapy Treatment/Discharge  Patient Details  Name: Meghan Lozano MRN: [id number] Date of Birth: 22-Feb-1961 Referring Provider (PT): Sarina Ill, MD   Encounter Date: 01/05/2018    Past Medical History:  Diagnosis Date  . Arthritis    bilateral hands and neck  . Elevated LFTs   . Hyperlipidemia   . Hypothyroidism   . Hypothyroidism     Past Surgical History:  Procedure Laterality Date  . FUNCTIONAL ENDOSCOPIC SINUS SURGERY  1981  . GANGLION CYST EXCISION Right 1975   wrist  . TONSILLECTOMY AND ADENOIDECTOMY  1966    There were no vitals filed for this visit.                              PT Short Term Goals - 12/22/17 1135      PT SHORT TERM GOAL #2   Title  Return postural demos to decrease cervical tension    Time  2    Period  Weeks    Status  New        PT Long Term Goals - 12/22/17 1132      PT LONG TERM GOAL #1   Title  Increase left cervical rotation AROM 10 deg or greater to improve ability to turn head while driving    Baseline  60 deg    Time  4    Period  Weeks    Status  New      PT LONG TERM GOAL #2   Title  Left wrist strength 5/5 and left grip strength increased 5-10 lbs. ot improve ability for hand/wrist use for chores, opening jars    Baseline  wrist flex 4/5, ext 4+/5, grip 32 lbs.    Time  4    Period  Weeks      PT LONG TERM GOAL #3   Title  Tolerate sitting for car travel, eating meals, reading periods of at least 30 min with pain 3/10 or less    Baseline  7/10 pain    Time  4    Period  Weeks    Status  New              Patient will benefit from skilled therapeutic intervention in order to improve the following deficits and impairments:  Pain, Postural dysfunction, Increased muscle spasms, Decreased activity tolerance, Decreased range  of motion  Visit Diagnosis: Neck pain  Cervical radiculopathy     Problem List Patient Active Problem List   Diagnosis Date Noted  . Cervical disc disorder at C5-C6 level with radiculopathy 12/14/2017  . GERD (gastroesophageal reflux disease) 09/23/2015  . Elevated LFTs   . Hypothyroidism          PHYSICAL THERAPY DISCHARGE SUMMARY  Visits from Start of Care: 2  Current functional level related to goals / functional outcomes: Status unknown, did not return after last visit 01/05/18   Remaining deficits: unknown   Education / Equipment: NA  Plan:                                                    Patient goals were not met. Patient is being discharged due  to not returning since the last visit.  ?????          Beaulah Dinning, PT, DPT 03/01/18 3:30 PM     [facility] [address] [place], [place], [address] Phone: [phone]   Fax:  [phone]  Name: Meghan Lozano MRN: [id number] Date of Birth: 08-17-1960

## 2018-03-03 ENCOUNTER — Other Ambulatory Visit: Payer: Self-pay | Admitting: Family Medicine

## 2018-03-19 ENCOUNTER — Other Ambulatory Visit: Payer: Self-pay | Admitting: Family Medicine

## 2018-03-19 NOTE — Telephone Encounter (Signed)
Requesting refill    Ambien  LOV: 11/17/17  LRF:  12/18/17

## 2018-07-12 ENCOUNTER — Other Ambulatory Visit: Payer: Self-pay | Admitting: Family Medicine

## 2018-07-13 NOTE — Telephone Encounter (Signed)
Requesting refill    Ambien  LOV: 12/18/17  LRF:  03/20/18

## 2018-08-08 ENCOUNTER — Other Ambulatory Visit: Payer: Self-pay | Admitting: Family Medicine

## 2018-08-20 ENCOUNTER — Other Ambulatory Visit: Payer: Self-pay | Admitting: Family Medicine

## 2018-08-29 ENCOUNTER — Other Ambulatory Visit: Payer: Self-pay | Admitting: *Deleted

## 2018-08-29 MED ORDER — SYNTHROID 100 MCG PO TABS: ORAL_TABLET | ORAL | 1 refills | Status: DC

## 2018-09-13 ENCOUNTER — Encounter: Payer: Self-pay | Admitting: Family Medicine

## 2018-09-13 MED ORDER — SYNTHROID 100 MCG PO TABS: ORAL_TABLET | ORAL | 3 refills | Status: DC

## 2018-11-03 IMAGING — XA DG INJECT/[PERSON_NAME] INC NEEDLE/CATH/PLC EPI/CERV/THOR W/IMG
2 series · 2 of 2 positions shown · non-contrast
Comparison: none

CLINICAL DATA: Cervical disc disorder at C5-6. Left C5 and C7
radiculopathy.

[Series 1: ortho standard · 1 of 1 slices shown (1 of 2)]
[im 1/1]
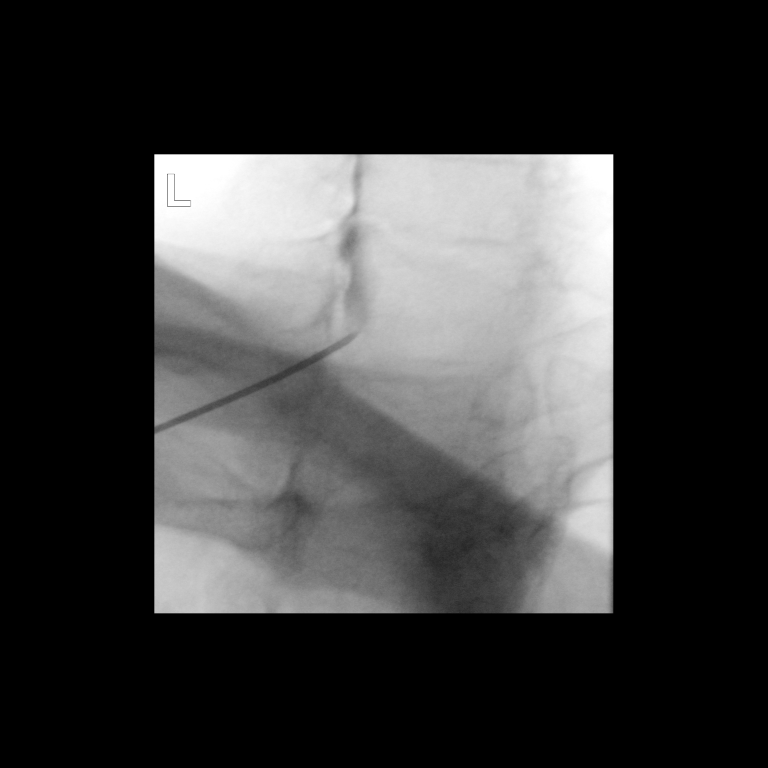

[Series 2: ortho standard · 1 of 1 slices shown (2 of 2)]
[im 1/1]
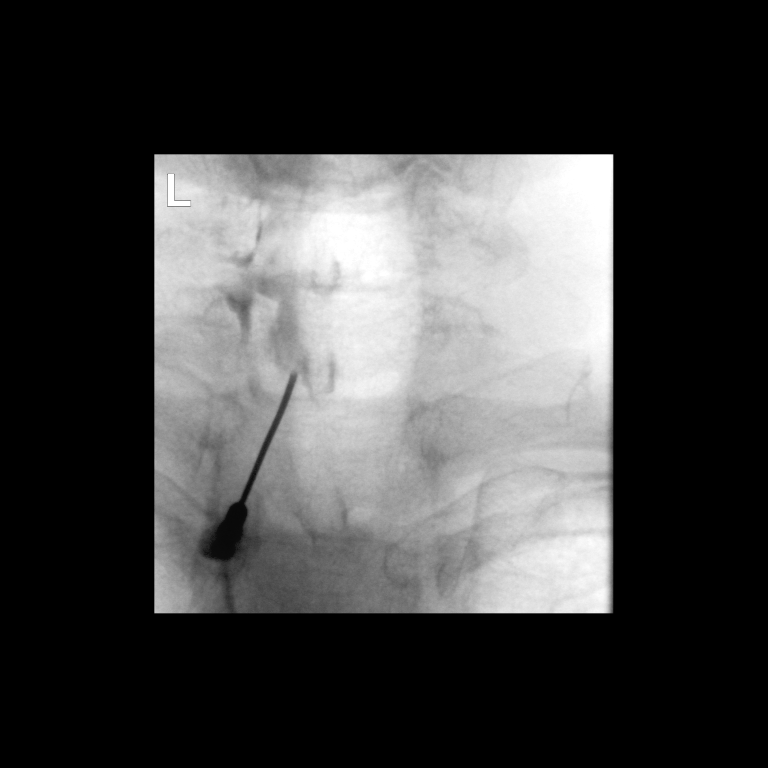

[2 of 2 positions shown; findings below may reference images not displayed]

FLUOROSCOPY TIME:  Radiation Exposure Index (as provided by the
fluoroscopic device): 11.24 uGy*m2

Fluoroscopy Time:  20 seconds

Number of Acquired Images:  0

PROCEDURE:
CERVICAL EPIDURAL INJECTION

An interlaminar approach was performed on the left at C7-T1. A 20
gauge epidural needle was advanced using loss-of-resistance
technique.

DIAGNOSTIC EPIDURAL INJECTION

Injection of Isovue-M 300 shows a good epidural pattern with spread
above and below the level of needle placement, primarily on the
left. No vascular opacification is seen. THERAPEUTIC

EPIDURAL INJECTION

1.5 ml of Kenalog 40 mixed with 1 ml of 1% Lidocaine and 2 ml of
normal saline were then instilled. The procedure was well-tolerated,
and the patient was discharged thirty minutes following the
injection in good condition.
IMPRESSION: Technically successful first epidural injection on the left at
C7-T1.

## 2018-11-21 ENCOUNTER — Other Ambulatory Visit: Payer: Self-pay | Admitting: Family Medicine

## 2018-11-23 ENCOUNTER — Other Ambulatory Visit: Payer: Self-pay | Admitting: Family Medicine

## 2018-11-23 NOTE — Telephone Encounter (Signed)
Last refilled: 07/13/2018

## 2018-12-23 ENCOUNTER — Encounter: Payer: Self-pay | Admitting: Family Medicine

## 2018-12-24 MED ORDER — SYNTHROID 100 MCG PO TABS: ORAL_TABLET | ORAL | 0 refills | Status: DC

## 2019-02-05 ENCOUNTER — Other Ambulatory Visit: Payer: Self-pay

## 2019-02-05 DIAGNOSIS — Z20822 Contact with and (suspected) exposure to covid-19: Secondary | ICD-10-CM

## 2019-02-07 LAB — NOVEL CORONAVIRUS, NAA: SARS-CoV-2, NAA: NOT DETECTED

## 2019-03-21 ENCOUNTER — Other Ambulatory Visit: Payer: Self-pay | Admitting: Family Medicine

## 2019-03-21 MED ORDER — SYNTHROID 100 MCG PO TABS: ORAL_TABLET | ORAL | 0 refills | Status: DC

## 2019-03-25 ENCOUNTER — Other Ambulatory Visit: Payer: Self-pay | Admitting: Family Medicine

## 2019-03-25 NOTE — Telephone Encounter (Signed)
Ok to refill??  Last office visit 10/21/2017.  Last refill 11/23/2018, #3 refills.   Of note, letter sent to patient to schedule OV.

## 2019-04-24 ENCOUNTER — Other Ambulatory Visit: Payer: Self-pay | Admitting: Family Medicine

## 2019-04-24 NOTE — Telephone Encounter (Signed)
Requesting refill    Ambien  LOV: 11/17/17  LRF:  03/25/2019

## 2019-04-30 ENCOUNTER — Other Ambulatory Visit: Payer: Self-pay | Admitting: *Deleted

## 2019-04-30 NOTE — Telephone Encounter (Signed)
Received fax requesting PA on name brand Synthroid.   Noted that patient prescription does state DAW, but patient has been on generic levothyroxine in the past.  MyChart message sent to patient to clarify.

## 2019-05-23 ENCOUNTER — Other Ambulatory Visit: Payer: Self-pay | Admitting: Family Medicine

## 2019-05-27 ENCOUNTER — Telehealth (INDEPENDENT_AMBULATORY_CARE_PROVIDER_SITE_OTHER): Payer: Self-pay | Admitting: Family Medicine

## 2019-05-27 DIAGNOSIS — F5101 Primary insomnia: Secondary | ICD-10-CM

## 2019-05-27 DIAGNOSIS — F4329 Adjustment disorder with other symptoms: Secondary | ICD-10-CM

## 2019-05-27 MED ORDER — BUPROPION HCL ER (XL) 150 MG PO TB24: 150.0000 mg | ORAL_TABLET | Freq: Every day | ORAL | 2 refills | Status: DC

## 2019-05-27 MED ORDER — ZOLPIDEM TARTRATE 10 MG PO TABS: 10.0000 mg | ORAL_TABLET | Freq: Every evening | ORAL | 2 refills | Status: DC | PRN

## 2019-05-27 NOTE — Progress Notes (Signed)
Subjective:    Patient ID: Meghan Lozano, female    DOB: 12-Mar-1961, 59 y.o.   MRN: [id number]  HPI Patient is a very pleasant 59 year old Caucasian female who is being seen today as a telephone/video visit via MyChart.  Phone call on video conference call began at 1008.  Visit concluded at 1023.  Patient consents to be seen via video visit.  She is requesting a refill on her Ambien.  Unfortunately she requires Ambien 10 mg every night to help her sleep.  Without the medication, her thoughts race at night.  She is unable to turn off her mind.  She will ruminate over things that she is concerned about and this keeps her from being able to sleep.  She states that she will often toss and turn in bed for hours unable to fall asleep without the medication.  Sometimes she will take just a half a tablet.  Often she will not take the medication when her grandchildren visit so that she can hear them in case something happens.  On those nights she is unable to sleep at all.  She also reports feeling more stressed.  Over the last year, she is extremely worried about the health of her parents due to the COVID-19 pandemic.  Previously she had taken Lexapro but she discontinue the medication due to weight gain.  She also states that the medicine caused her to have a delayed reaction time and slower processing her thoughts.  Although it helped with anxiety and stress she did not like the way that she felt and so she stopped the medication.  Approximately 1 month ago she resumed the medication due to the anxiety that she is feeling over the Covid pandemic and caring for her parents.  However she would like an option that would not cause the weight gain or perhaps as many of the side effects. Past Medical History:  Diagnosis Date  . Arthritis    bilateral hands and neck  . Elevated LFTs   . Hyperlipidemia   . Hypothyroidism   . Hypothyroidism    Past Surgical History:  Procedure Laterality Date  . FUNCTIONAL  ENDOSCOPIC SINUS SURGERY  1981  . GANGLION CYST EXCISION Right 1975   wrist  . TONSILLECTOMY AND ADENOIDECTOMY  1966   Current Outpatient Medications on File Prior to Visit  Medication Sig Dispense Refill  . escitalopram (LEXAPRO) 10 MG tablet TAKE 1 TABLET BY MOUTH DAILY. 30 tablet 5  . SYNTHROID 100 MCG tablet TAKE 1 TABLET BY MOUTH DAILY BEFORE BREAKFAST. 30 tablet 3   No current facility-administered medications on file prior to visit.   Allergies  Allergen Reactions  . Betadine [Povidone Iodine] Swelling  . Demerol Nausea And Vomiting  . Fish Allergy Nausea And Vomiting  . Shrimp [Shellfish Allergy] Nausea And Vomiting   Social History   Socioeconomic History  . Marital status: Married    Spouse name: Not on file  . Number of children: Not on file  . Years of education: Not on file  . Highest education level: Not on file  Occupational History  . Not on file  Tobacco Use  . Smoking status: Never Smoker  . Smokeless tobacco: Never Used  Substance and Sexual Activity  . Alcohol use: Yes    Alcohol/week: 1.0 standard drinks    Types: 1 Glasses of wine per week  . Drug use: No  . Sexual activity: Not on file    Comment: married to [employer],  Other  Topics Concern  . Not on file  Social History Narrative  . Not on file   Social Determinants of Health   Financial Resource Strain:   . Difficulty of Paying Living Expenses: Not on file  Food Insecurity:   . Worried About Charity fundraiser in the Last Year: Not on file  . Ran Out of Food in the Last Year: Not on file  Transportation Needs:   . Lack of Transportation (Medical): Not on file  . Lack of Transportation (Non-Medical): Not on file  Physical Activity:   . Days of Exercise per Week: Not on file  . Minutes of Exercise per Session: Not on file  Stress:   . Feeling of Stress : Not on file  Social Connections:   . Frequency of Communication with Friends and Family: Not on file  . Frequency of Social Gatherings  with Friends and Family: Not on file  . Attends Religious Services: Not on file  . Active Member of Clubs or Organizations: Not on file  . Attends Archivist Meetings: Not on file  . Marital Status: Not on file  Intimate Partner Violence:   . Fear of Current or Ex-Partner: Not on file  . Emotionally Abused: Not on file  . Physically Abused: Not on file  . Sexually Abused: Not on file      Review of Systems  All other systems reviewed and are negative.      Objective:   Physical Exam        Assessment & Plan:  Stress and adjustment reaction  Primary insomnia  We decided to discontinue Lexapro and since she is only been on the medication for 4 weeks I believe she can just stop it cold [place] without weaning off of it.  Instead we will replace it with Wellbutrin XL 150 mg p.o. every morning and then reassess in 3 to 4 weeks.  We will refill the Ambien 10 mg p.o. nightly at the present time.  I did discuss the risk of habituation and dependency in particular the risk if she gets older.  Ideally I like to try to switch to something nonhabit-forming such as trazodone or hydroxyzine.  However we will not do this at the present time, she is also switching to a different antidepressant.

## 2019-05-31 LAB — HM MAMMOGRAPHY

## 2019-06-13 ENCOUNTER — Encounter: Payer: Self-pay | Admitting: *Deleted

## 2019-06-19 ENCOUNTER — Encounter: Payer: Self-pay | Admitting: Family Medicine

## 2019-06-26 ENCOUNTER — Other Ambulatory Visit: Payer: Self-pay | Admitting: Family Medicine

## 2019-06-26 ENCOUNTER — Other Ambulatory Visit: Payer: 59

## 2019-06-26 ENCOUNTER — Other Ambulatory Visit: Payer: Self-pay

## 2019-06-26 DIAGNOSIS — Z0000 Encounter for general adult medical examination without abnormal findings: Secondary | ICD-10-CM

## 2019-06-27 LAB — CBC WITH DIFFERENTIAL/PLATELET
Absolute Monocytes: 599 cells/uL (ref 200–950)
Basophils Absolute: 57 cells/uL (ref 0–200)
Basophils Relative: 0.7 %
Eosinophils Absolute: 377 cells/uL (ref 15–500)
Eosinophils Relative: 4.6 %
HCT: 42.2 % (ref 35.0–45.0)
Hemoglobin: 14 g/dL (ref 11.7–15.5)
Lymphs Abs: 1066 cells/uL (ref 850–3900)
MCH: 30 pg (ref 27.0–33.0)
MCHC: 33.2 g/dL (ref 32.0–36.0)
MCV: 90.6 fL (ref 80.0–100.0)
MPV: 10.8 fL (ref 7.5–12.5)
Monocytes Relative: 7.3 %
Neutro Abs: 6101 cells/uL (ref 1500–7800)
Neutrophils Relative %: 74.4 %
Platelets: 274 10*3/uL (ref 140–400)
RBC: 4.66 10*6/uL (ref 3.80–5.10)
RDW: 11.8 % (ref 11.0–15.0)
Total Lymphocyte: 13 %
WBC: 8.2 10*3/uL (ref 3.8–10.8)

## 2019-06-27 LAB — COMPREHENSIVE METABOLIC PANEL
AG Ratio: 2 (calc) (ref 1.0–2.5)
ALT: 35 U/L — ABNORMAL HIGH (ref 6–29)
AST: 25 U/L (ref 10–35)
Albumin: 4.3 g/dL (ref 3.6–5.1)
Alkaline phosphatase (APISO): 85 U/L (ref 37–153)
BUN: 16 mg/dL (ref 7–25)
CO2: 28 mmol/L (ref 20–32)
Calcium: 9.4 mg/dL (ref 8.6–10.4)
Chloride: 105 mmol/L (ref 98–110)
Creat: 0.89 mg/dL (ref 0.50–0.99)
Globulin: 2.2 g/dL (calc) (ref 1.9–3.7)
Glucose, Bld: 94 mg/dL (ref 65–99)
Potassium: 4.6 mmol/L (ref 3.5–5.3)
Sodium: 141 mmol/L (ref 135–146)
Total Bilirubin: 0.4 mg/dL (ref 0.2–1.2)
Total Protein: 6.5 g/dL (ref 6.1–8.1)

## 2019-06-27 LAB — LIPID PANEL
Cholesterol: 238 mg/dL — ABNORMAL HIGH (ref <200)
HDL: 60 mg/dL (ref >=50)
LDL Cholesterol (Calc): 157 mg/dL (calc) — ABNORMAL HIGH
Non-HDL Cholesterol (Calc): 178 mg/dL (calc) — ABNORMAL HIGH (ref <130)
Total CHOL/HDL Ratio: 4 (calc) (ref <5.0)
Triglycerides: 97 mg/dL (ref <150)

## 2019-06-27 LAB — TSH: TSH: 3.41 mIU/L (ref 0.40–4.50)

## 2019-07-01 ENCOUNTER — Other Ambulatory Visit: Payer: Self-pay

## 2019-07-01 ENCOUNTER — Ambulatory Visit (INDEPENDENT_AMBULATORY_CARE_PROVIDER_SITE_OTHER): Payer: 59 | Admitting: Family Medicine

## 2019-07-01 ENCOUNTER — Encounter: Payer: Self-pay | Admitting: Family Medicine

## 2019-07-01 VITALS — BP 148/100 | HR 80 | Temp 96.5°F | Resp 14 | Ht 62.0 in | Wt 155.0 lb

## 2019-07-01 DIAGNOSIS — Z0001 Encounter for general adult medical examination with abnormal findings: Secondary | ICD-10-CM

## 2019-07-01 DIAGNOSIS — Z0000 Encounter for general adult medical examination without abnormal findings: Secondary | ICD-10-CM

## 2019-07-01 DIAGNOSIS — F5101 Primary insomnia: Secondary | ICD-10-CM | POA: Diagnosis not present

## 2019-07-01 DIAGNOSIS — F4329 Adjustment disorder with other symptoms: Secondary | ICD-10-CM | POA: Diagnosis not present

## 2019-07-01 DIAGNOSIS — E039 Hypothyroidism, unspecified: Secondary | ICD-10-CM

## 2019-07-01 MED ORDER — LEVOTHYROXINE SODIUM 112 MCG PO TABS: 112.0000 ug | ORAL_TABLET | Freq: Every day | ORAL | 3 refills | Status: DC

## 2019-07-01 MED ORDER — ALPRAZOLAM 0.5 MG PO TABS: 0.5000 mg | ORAL_TABLET | Freq: Three times a day (TID) | ORAL | 0 refills | Status: DC | PRN

## 2019-07-01 NOTE — Progress Notes (Signed)
Subjective:    Patient ID: Meghan Lozano, female    DOB: Aug 19, 1960, 59 y.o.   MRN: [id number]  HPI   Patient is here today for a complete physical exam.  Recently switched from Lexapro to Wellbutrin.  Since taking the Wellbutrin, she states that she feels like she is hungry all the time.  She does not feel that the medication is helping her at all with her anxiety.  She does not feel anxious all the time but at times she feels overcome with panic and worry for no reason.  She would prefer to have something that she could take sparingly just as needed.  I calculated her 10-year risk of cardiovascular disease even with her blood pressure today and found to be 4.5%.  The patient is checking her blood pressure at home and finding it to be 120/88.  Calculated her 10-year risk with that blood pressure lowers up to 3.6%.  Regardless, she does not qualify for statin therapy based on her cholesterol.  Her most recent lab work is listed below and is significant for a TSH of 3.4.  While within normal range, the patient states that she feels extremely tired with no motivation and no energy.  She would be interested in trying to increase her Synthroid to see if she can have a little bit more strength and stamina and energy and motivation.  She had a negative cologuard in 2019.  Due again next year.  GYN performs her pap smear and mammogram.   Lab on 06/26/2019  Component Date Value Ref Range Status  . WBC 06/26/2019 8.2  3.8 - 10.8 Thousand/uL Final  . RBC 06/26/2019 4.66  3.80 - 5.10 Million/uL Final  . Hemoglobin 06/26/2019 14.0  11.7 - 15.5 g/dL Final  . HCT 06/26/2019 42.2  35.0 - 45.0 % Final  . MCV 06/26/2019 90.6  80.0 - 100.0 fL Final  . MCH 06/26/2019 30.0  27.0 - 33.0 pg Final  . MCHC 06/26/2019 33.2  32.0 - 36.0 g/dL Final  . RDW 06/26/2019 11.8  11.0 - 15.0 % Final  . Platelets 06/26/2019 274  140 - 400 Thousand/uL Final  . MPV 06/26/2019 10.8  7.5 - 12.5 fL Final  . Neutro Abs 06/26/2019  6,101  1,500 - 7,800 cells/uL Final  . Lymphs Abs 06/26/2019 1,066  850 - 3,900 cells/uL Final  . Absolute Monocytes 06/26/2019 599  200 - 950 cells/uL Final  . Eosinophils Absolute 06/26/2019 377  15 - 500 cells/uL Final  . Basophils Absolute 06/26/2019 57  0 - 200 cells/uL Final  . Neutrophils Relative % 06/26/2019 74.4  % Final  . Total Lymphocyte 06/26/2019 13.0  % Final  . Monocytes Relative 06/26/2019 7.3  % Final  . Eosinophils Relative 06/26/2019 4.6  % Final  . Basophils Relative 06/26/2019 0.7  % Final  . Cholesterol 06/26/2019 238* <200 mg/dL Final  . HDL 06/26/2019 60  > OR = 50 mg/dL Final  . Triglycerides 06/26/2019 97  <150 mg/dL Final  . LDL Cholesterol (Calc) 06/26/2019 157* mg/dL (calc) Final   Comment: Reference range: <100 . Desirable range <100 mg/dL for primary prevention;   <70 mg/dL for patients with CHD or diabetic patients  with > or = 2 CHD risk factors. Marland Kitchen LDL-C is now calculated using the Martin-Hopkins  calculation, which is a validated novel method providing  better accuracy than the Friedewald equation in the  estimation of LDL-C.  Cresenciano Genre et al. Annamaria Helling. 8315;176(16): [phone]  (  http://education.[facility].com/faq/FAQ164)   . Total CHOL/HDL Ratio 06/26/2019 4.0  <0.6 (calc) Final  . Non-HDL Cholesterol (Calc) 06/26/2019 178* <130 mg/dL (calc) Final   Comment: For patients with diabetes plus 1 major ASCVD risk  factor, treating to a non-HDL-C goal of <100 mg/dL  (LDL-C of <26 mg/dL) is considered a therapeutic  option.   . Glucose, Bld 06/26/2019 94  65 - 99 mg/dL Final   Comment: .            Fasting reference interval .   . BUN 06/26/2019 16  7 - 25 mg/dL Final  . Creat 94/85/4627 0.89  0.50 - 0.99 mg/dL Final   Comment: For patients >75 years of age, the reference limit for Creatinine is approximately 13% higher for people identified as African-American. .   Edwena Felty Ratio 06/26/2019 NOT APPLICABLE  6 - 22 (calc) Final  .  Sodium 06/26/2019 141  135 - 146 mmol/L Final  . Potassium 06/26/2019 4.6  3.5 - 5.3 mmol/L Final  . Chloride 06/26/2019 105  98 - 110 mmol/L Final  . CO2 06/26/2019 28  20 - 32 mmol/L Final  . Calcium 06/26/2019 9.4  8.6 - 10.4 mg/dL Final  . Total Protein 06/26/2019 6.5  6.1 - 8.1 g/dL Final  . Albumin 03/50/0938 4.3  3.6 - 5.1 g/dL Final  . Globulin 18/29/9371 2.2  1.9 - 3.7 g/dL (calc) Final  . AG Ratio 06/26/2019 2.0  1.0 - 2.5 (calc) Final  . Total Bilirubin 06/26/2019 0.4  0.2 - 1.2 mg/dL Final  . Alkaline phosphatase (APISO) 06/26/2019 85  37 - 153 U/L Final  . AST 06/26/2019 25  10 - 35 U/L Final  . ALT 06/26/2019 35* 6 - 29 U/L Final  . TSH 06/26/2019 3.41  0.40 - 4.50 mIU/L Final  Abstract on 06/13/2019  Component Date Value Ref Range Status  . HM Mammogram 05/31/2019 0-4 Bi-Rad  0-4 Bi-Rad, Self Reported Normal Final   1- benign    Past Medical History:  Diagnosis Date  . Arthritis    bilateral hands and neck  . Elevated LFTs   . Hyperlipidemia   . Hypothyroidism   . Hypothyroidism    Past Surgical History:  Procedure Laterality Date  . FUNCTIONAL ENDOSCOPIC SINUS SURGERY  1981  . GANGLION CYST EXCISION Right 1975   wrist  . TONSILLECTOMY AND ADENOIDECTOMY  1966   Current Outpatient Medications on File Prior to Visit  Medication Sig Dispense Refill  . buPROPion (WELLBUTRIN XL) 150 MG 24 hr tablet Take 1 tablet (150 mg total) by mouth daily. Stop lexapro 30 tablet 2  . SYNTHROID 100 MCG tablet TAKE 1 TABLET BY MOUTH DAILY BEFORE BREAKFAST. 30 tablet 3  . zolpidem (AMBIEN) 10 MG tablet Take 1 tablet (10 mg total) by mouth at bedtime as needed for sleep (Need office visit before further refills). 30 tablet 2   No current facility-administered medications on file prior to visit.   Allergies  Allergen Reactions  . Betadine [Povidone Iodine] Swelling  . Demerol Nausea And Vomiting  . Fish Allergy Nausea And Vomiting  . Shrimp [Shellfish Allergy] Nausea And  Vomiting   Social History   Socioeconomic History  . Marital status: Married    Spouse name: Not on file  . Number of children: Not on file  . Years of education: Not on file  . Highest education level: Not on file  Occupational History  . Not on file  Tobacco Use  . Smoking status:  Never Smoker  . Smokeless tobacco: Never Used  Substance and Sexual Activity  . Alcohol use: Yes    Alcohol/week: 1.0 standard drinks    Types: 1 Glasses of wine per week  . Drug use: No  . Sexual activity: Not on file    Comment: married to August Saucer,  Other Topics Concern  . Not on file  Social History Narrative  . Not on file   Social Determinants of Health   Financial Resource Strain:   . Difficulty of Paying Living Expenses:   Food Insecurity:   . Worried About Programme researcher, broadcasting/film/video in the Last Year:   . Barista in the Last Year:   Transportation Needs:   . Freight forwarder (Medical):   Marland Kitchen Lack of Transportation (Non-Medical):   Physical Activity:   . Days of Exercise per Week:   . Minutes of Exercise per Session:   Stress:   . Feeling of Stress :   Social Connections:   . Frequency of Communication with Friends and Family:   . Frequency of Social Gatherings with Friends and Family:   . Attends Religious Services:   . Active Member of Clubs or Organizations:   . Attends Banker Meetings:   Marland Kitchen Marital Status:   Intimate Partner Violence:   . Fear of Current or Ex-Partner:   . Emotionally Abused:   Marland Kitchen Physically Abused:   . Sexually Abused:       Review of Systems  All other systems reviewed and are negative.      Objective:   Physical Exam  Constitutional: She is oriented to person, place, and time. She appears well-developed and well-nourished. No distress.  Eyes: Pupils are equal, round, and reactive to light. Conjunctivae are normal. No scleral icterus.  Neck: No JVD present. No thyromegaly present.  Cardiovascular: Normal rate, regular rhythm and  normal heart sounds. Exam reveals no gallop and no friction rub.  No murmur heard. Pulmonary/Chest: Effort normal and breath sounds normal. No respiratory distress. She has no wheezes. She has no rales.  Abdominal: Soft. Bowel sounds are normal.  Musculoskeletal:        General: No edema.     Cervical back: Neck supple.  Neurological: She is alert and oriented to person, place, and time. No cranial nerve deficit. She exhibits normal muscle tone. Coordination normal.  Skin: She is not diaphoretic.  Psychiatric: She has a normal mood and affect. Her behavior is normal. Judgment and thought content normal.  Vitals reviewed.        Assessment & Plan:  General medical exam  Stress and adjustment reaction  Primary insomnia  Hypothyroidism, unspecified type  Physical exam today is completely normal.  I encouraged 30 minutes a day 5 days a week of aerobic exercise to help lower her cholesterol.  We will discontinue the Wellbutrin altogether.  Instead we will replace with Xanax 0.5 mg tablets every 8 hours as needed for severe anxiety.  She will use the medication sparingly and not combine it with Ambien.  Mammogram is up-to-date.  Pap smear is due next year per her report.  Cologuard is due next year.  We discussed the bone density today but she defers this at the present time.  Increase Synthroid to 112 mcg a day and recheck a TSH in 2 months

## 2019-08-28 ENCOUNTER — Encounter: Payer: Self-pay | Admitting: Family Medicine

## 2019-08-28 ENCOUNTER — Other Ambulatory Visit: Payer: Self-pay | Admitting: Family Medicine

## 2019-08-28 NOTE — Telephone Encounter (Signed)
Ok to refill??  Last office visit 07/01/2019.  Last refill 05/27/2019, #2 refill.

## 2019-08-29 ENCOUNTER — Ambulatory Visit: Payer: Self-pay | Admitting: Nurse Practitioner

## 2019-09-11 ENCOUNTER — Other Ambulatory Visit: Payer: Self-pay

## 2019-09-11 ENCOUNTER — Ambulatory Visit: Payer: 59

## 2019-09-11 ENCOUNTER — Other Ambulatory Visit: Payer: 59

## 2019-09-11 DIAGNOSIS — E039 Hypothyroidism, unspecified: Secondary | ICD-10-CM

## 2019-09-11 LAB — TSH: TSH: 0.54 mIU/L (ref 0.40–4.50)

## 2019-10-28 ENCOUNTER — Encounter: Payer: Self-pay | Admitting: Family Medicine

## 2019-12-02 ENCOUNTER — Other Ambulatory Visit: Payer: Self-pay

## 2019-12-02 MED ORDER — LEVOTHYROXINE SODIUM 112 MCG PO TABS: 112.0000 ug | ORAL_TABLET | Freq: Every day | ORAL | 0 refills | Status: DC

## 2019-12-13 ENCOUNTER — Other Ambulatory Visit: Payer: Self-pay | Admitting: Family Medicine

## 2019-12-13 NOTE — Telephone Encounter (Signed)
Ok to refill??  Last office visit 12/02/2019.  Last refill 08/28/2019, #2 refills.

## 2019-12-30 ENCOUNTER — Other Ambulatory Visit: Payer: Self-pay | Admitting: Family Medicine

## 2019-12-30 NOTE — Telephone Encounter (Signed)
Ok to refill??  Last office visit/ refill 4/12/221.

## 2020-04-21 ENCOUNTER — Other Ambulatory Visit: Payer: Self-pay | Admitting: Family Medicine

## 2020-06-01 DIAGNOSIS — Z1231 Encounter for screening mammogram for malignant neoplasm of breast: Secondary | ICD-10-CM | POA: Diagnosis not present

## 2020-06-01 LAB — HM MAMMOGRAPHY

## 2020-07-10 DIAGNOSIS — Z01419 Encounter for gynecological examination (general) (routine) without abnormal findings: Secondary | ICD-10-CM | POA: Diagnosis not present

## 2020-07-10 DIAGNOSIS — Z6829 Body mass index (BMI) 29.0-29.9, adult: Secondary | ICD-10-CM | POA: Diagnosis not present

## 2020-07-13 ENCOUNTER — Other Ambulatory Visit: Payer: Self-pay

## 2020-07-14 ENCOUNTER — Other Ambulatory Visit: Payer: Self-pay

## 2020-07-14 ENCOUNTER — Other Ambulatory Visit: Payer: BC Managed Care – PPO

## 2020-07-14 DIAGNOSIS — E039 Hypothyroidism, unspecified: Secondary | ICD-10-CM

## 2020-07-14 LAB — COMPLETE METABOLIC PANEL WITH GFR
AG Ratio: 2.1 (calc) (ref 1.0–2.5)
ALT: 32 U/L — ABNORMAL HIGH (ref 6–29)
AST: 36 U/L — ABNORMAL HIGH (ref 10–35)
Albumin: 4.4 g/dL (ref 3.6–5.1)
Alkaline phosphatase (APISO): 82 U/L (ref 37–153)
BUN: 18 mg/dL (ref 7–25)
CO2: 25 mmol/L (ref 20–32)
Calcium: 9.4 mg/dL (ref 8.6–10.4)
Chloride: 105 mmol/L (ref 98–110)
Creat: 0.88 mg/dL (ref 0.50–0.99)
GFR, Est African American: 82 mL/min/{1.73_m2} (ref >=60)
GFR, Est Non African American: 71 mL/min/{1.73_m2} (ref >=60)
Globulin: 2.1 g/dL (calc) (ref 1.9–3.7)
Glucose, Bld: 101 mg/dL — ABNORMAL HIGH (ref 65–99)
Potassium: 4.8 mmol/L (ref 3.5–5.3)
Sodium: 139 mmol/L (ref 135–146)
Total Bilirubin: 0.3 mg/dL (ref 0.2–1.2)
Total Protein: 6.5 g/dL (ref 6.1–8.1)

## 2020-07-14 LAB — CBC WITH DIFFERENTIAL/PLATELET
Absolute Monocytes: 522 cells/uL (ref 200–950)
Basophils Absolute: 60 cells/uL (ref 0–200)
Basophils Relative: 1 %
Eosinophils Absolute: 264 cells/uL (ref 15–500)
Eosinophils Relative: 4.4 %
HCT: 42 % (ref 35.0–45.0)
Hemoglobin: 13.7 g/dL (ref 11.7–15.5)
Lymphs Abs: 1422 cells/uL (ref 850–3900)
MCH: 28.8 pg (ref 27.0–33.0)
MCHC: 32.6 g/dL (ref 32.0–36.0)
MCV: 88.2 fL (ref 80.0–100.0)
MPV: 10.9 fL (ref 7.5–12.5)
Monocytes Relative: 8.7 %
Neutro Abs: 3732 cells/uL (ref 1500–7800)
Neutrophils Relative %: 62.2 %
Platelets: 310 10*3/uL (ref 140–400)
RBC: 4.76 10*6/uL (ref 3.80–5.10)
RDW: 11.6 % (ref 11.0–15.0)
Total Lymphocyte: 23.7 %
WBC: 6 10*3/uL (ref 3.8–10.8)

## 2020-07-14 LAB — T3, FREE: T3, Free: 2.9 pg/mL (ref 2.3–4.2)

## 2020-07-14 LAB — TSH: TSH: 0.44 mIU/L (ref 0.40–4.50)

## 2020-07-14 LAB — T4, FREE: Free T4: 1.6 ng/dL (ref 0.8–1.8)

## 2020-07-15 LAB — LIPID PANEL
Cholesterol: 211 mg/dL — ABNORMAL HIGH (ref <200)
HDL: 63 mg/dL (ref >=50)
LDL Cholesterol (Calc): 132 mg/dL (calc) — ABNORMAL HIGH
Non-HDL Cholesterol (Calc): 148 mg/dL (calc) — ABNORMAL HIGH (ref <130)
Total CHOL/HDL Ratio: 3.3 (calc) (ref <5.0)
Triglycerides: 72 mg/dL (ref <150)

## 2020-07-20 ENCOUNTER — Other Ambulatory Visit: Payer: Self-pay

## 2020-07-20 ENCOUNTER — Ambulatory Visit (INDEPENDENT_AMBULATORY_CARE_PROVIDER_SITE_OTHER): Payer: BC Managed Care – PPO | Admitting: Family Medicine

## 2020-07-20 ENCOUNTER — Encounter: Payer: Self-pay | Admitting: Family Medicine

## 2020-07-20 VITALS — BP 118/76 | HR 76 | Temp 97.6°F | Resp 18 | Ht 62.0 in | Wt 154.2 lb

## 2020-07-20 DIAGNOSIS — E039 Hypothyroidism, unspecified: Secondary | ICD-10-CM

## 2020-07-20 DIAGNOSIS — Z0001 Encounter for general adult medical examination with abnormal findings: Secondary | ICD-10-CM | POA: Diagnosis not present

## 2020-07-20 DIAGNOSIS — Z0000 Encounter for general adult medical examination without abnormal findings: Secondary | ICD-10-CM

## 2020-07-20 DIAGNOSIS — F5101 Primary insomnia: Secondary | ICD-10-CM

## 2020-07-20 DIAGNOSIS — R7989 Other specified abnormal findings of blood chemistry: Secondary | ICD-10-CM

## 2020-07-20 MED ORDER — ALPRAZOLAM 0.5 MG PO TABS: 0.5000 mg | ORAL_TABLET | Freq: Three times a day (TID) | ORAL | 0 refills | Status: DC | PRN

## 2020-07-20 MED ORDER — ZOLPIDEM TARTRATE 10 MG PO TABS: ORAL_TABLET | ORAL | 2 refills | Status: DC

## 2020-07-20 NOTE — Progress Notes (Signed)
Subjective:    Patient ID: Meghan PeakPamela H Lozano, female    DOB: 15-Oct-1960, 60 y.o.   MRN: [id number]  HPI   Patient is here today for a complete physical exam.  She reports fatigue.  She reports trouble losing weight.  She complains of hot flashes.  Her gynecologist recently started her on phentermine to help with weight loss.  She states that she has no energy or desire to exercise.  She tries walking but she is not performing intensive physical exercise with any regularity.  She denies any chest pain or shortness of breath.  She denies any depression.  She does report trouble sleeping.  She has been taking a half Ambien at night to help her sleep.  Without the Ambien, she wakes up around 3 in the morning unable to sleep.  She also uses Xanax sparingly and infrequently for anxiety when her stress levels are extremely high.  Pap smear was just performed by her gynecologist.  Mammogram is up-to-date. Orders Only on 07/14/2020  Component Date Value Ref Range Status  . WBC 07/14/2020 6.0  3.8 - 10.8 Thousand/uL Final  . RBC 07/14/2020 4.76  3.80 - 5.10 Million/uL Final  . Hemoglobin 07/14/2020 13.7  11.7 - 15.5 g/dL Final  . HCT 40/98/119104/26/2022 42.0  35.0 - 45.0 % Final  . MCV 07/14/2020 88.2  80.0 - 100.0 fL Final  . MCH 07/14/2020 28.8  27.0 - 33.0 pg Final  . MCHC 07/14/2020 32.6  32.0 - 36.0 g/dL Final  . RDW 47/82/956204/26/2022 11.6  11.0 - 15.0 % Final  . Platelets 07/14/2020 310  140 - 400 Thousand/uL Final  . MPV 07/14/2020 10.9  7.5 - 12.5 fL Final  . Neutro Abs 07/14/2020 3,732  1,500 - 7,800 cells/uL Final  . Lymphs Abs 07/14/2020 1,422  850 - 3,900 cells/uL Final  . Absolute Monocytes 07/14/2020 522  200 - 950 cells/uL Final  . Eosinophils Absolute 07/14/2020 264  15 - 500 cells/uL Final  . Basophils Absolute 07/14/2020 60  0 - 200 cells/uL Final  . Neutrophils Relative % 07/14/2020 62.2  % Final  . Total Lymphocyte 07/14/2020 23.7  % Final  . Monocytes Relative 07/14/2020 8.7  % Final  .  Eosinophils Relative 07/14/2020 4.4  % Final  . Basophils Relative 07/14/2020 1.0  % Final  . TSH 07/14/2020 0.44  0.40 - 4.50 mIU/L Final  . Glucose, Bld 07/14/2020 101* 65 - 99 mg/dL Final   Comment: .            Fasting reference interval . For someone without known diabetes, a glucose value between 100 and 125 mg/dL is consistent with prediabetes and should be confirmed with a follow-up test. .   . BUN 07/14/2020 18  7 - 25 mg/dL Final  . Creat 13/08/657804/26/2022 0.88  0.50 - 0.99 mg/dL Final   Comment: For patients >61 years of age, the reference limit for Creatinine is approximately 13% higher for people identified as African-American. .   . GFR, Est Non African American 07/14/2020 71  > OR = 60 mL/min/1.5173m2 Final  . GFR, Est African American 07/14/2020 82  > OR = 60 mL/min/1.2773m2 Final  . BUN/Creatinine Ratio 07/14/2020 NOT APPLICABLE  6 - 22 (calc) Final  . Sodium 07/14/2020 139  135 - 146 mmol/L Final  . Potassium 07/14/2020 4.8  3.5 - 5.3 mmol/L Final  . Chloride 07/14/2020 105  98 - 110 mmol/L Final  . CO2 07/14/2020 25  20 - 32  mmol/L Final  . Calcium 07/14/2020 9.4  8.6 - 10.4 mg/dL Final  . Total Protein 07/14/2020 6.5  6.1 - 8.1 g/dL Final  . Albumin 63/84/6659 4.4  3.6 - 5.1 g/dL Final  . Globulin 93/57/0177 2.1  1.9 - 3.7 g/dL (calc) Final  . AG Ratio 07/14/2020 2.1  1.0 - 2.5 (calc) Final  . Total Bilirubin 07/14/2020 0.3  0.2 - 1.2 mg/dL Final  . Alkaline phosphatase (APISO) 07/14/2020 82  37 - 153 U/L Final  . AST 07/14/2020 36* 10 - 35 U/L Final  . ALT 07/14/2020 32* 6 - 29 U/L Final  . Cholesterol 07/14/2020 211* <200 mg/dL Final  . HDL 93/90/3009 63  > OR = 50 mg/dL Final  . Triglycerides 07/14/2020 72  <150 mg/dL Final  . LDL Cholesterol (Calc) 07/14/2020 132* mg/dL (calc) Final   Comment: Reference range: <100 . Desirable range <100 mg/dL for primary prevention;   <70 mg/dL for patients with CHD or diabetic patients  with > or = 2 CHD risk  factors. Marland Kitchen LDL-C is now calculated using the Martin-Hopkins  calculation, which is a validated novel method providing  better accuracy than the Friedewald equation in the  estimation of LDL-C.  Horald Pollen et al. Lenox Ahr. 2330;076(22): 2061-2068  (http://education.[facility].com/faq/FAQ164)   . Total CHOL/HDL Ratio 07/14/2020 3.3  <6.3 (calc) Final  . Non-HDL Cholesterol (Calc) 07/14/2020 148* <130 mg/dL (calc) Final   Comment: For patients with diabetes plus 1 major ASCVD risk  factor, treating to a non-HDL-C goal of <100 mg/dL  (LDL-C of <33 mg/dL) is considered a therapeutic  option.   Lab on 07/14/2020  Component Date Value Ref Range Status  . T3, Free 07/14/2020 2.9  2.3 - 4.2 pg/mL Final  . Free T4 07/14/2020 1.6  0.8 - 1.8 ng/dL Final    Past Medical History:  Diagnosis Date  . Anxiety    Phreesia 07/18/2020  . Arthritis    bilateral hands and neck  . Elevated LFTs   . Hyperlipidemia   . Hypothyroidism   . Hypothyroidism   . Thyroid disease    Phreesia 07/18/2020   Past Surgical History:  Procedure Laterality Date  . FUNCTIONAL ENDOSCOPIC SINUS SURGERY  1981  . GANGLION CYST EXCISION Right 1975   wrist  . TONSILLECTOMY AND ADENOIDECTOMY  1966   Current Outpatient Medications on File Prior to Visit  Medication Sig Dispense Refill  . ALPRAZolam (XANAX) 0.5 MG tablet TAKE 1 TABLET BY MOUTH 3 TIMES A DAY AS NEEDED 30 tablet 0  . clobetasol cream (TEMOVATE) 0.05 % Apply 1 application topically 2 (two) times daily.    Marland Kitchen levothyroxine (SYNTHROID) 112 MCG tablet Take 1 tablet (112 mcg total) by mouth daily. 90 tablet 0  . zolpidem (AMBIEN) 10 MG tablet TAKE 1 TABLET BY MOUTH AT BEDTIME AS NEEDED FOR SLEEP (NEED OFFICE VISIT BEFORE FURTHER REFILLS). 30 tablet 2   No current facility-administered medications on file prior to visit.   Allergies  Allergen Reactions  . Other   . Betadine [Povidone Iodine] Swelling  . Demerol Nausea And Vomiting  . Fish Allergy  Nausea And Vomiting  . Shrimp [Shellfish Allergy] Nausea And Vomiting  . Iodine Itching and Rash   Social History   Socioeconomic History  . Marital status: Married    Spouse name: Not on file  . Number of children: Not on file  . Years of education: Not on file  . Highest education level: Not on file  Occupational History  .  Not on file  Tobacco Use  . Smoking status: Never Smoker  . Smokeless tobacco: Never Used  Substance and Sexual Activity  . Alcohol use: Yes    Alcohol/week: 1.0 standard drink    Types: 1 Glasses of wine per week  . Drug use: No  . Sexual activity: Not on file    Comment: married to August Saucer,  Other Topics Concern  . Not on file  Social History Narrative  . Not on file   Social Determinants of Health   Financial Resource Strain: Not on file  Food Insecurity: Not on file  Transportation Needs: Not on file  Physical Activity: Not on file  Stress: Not on file  Social Connections: Not on file  Intimate Partner Violence: Not on file      Review of Systems  All other systems reviewed and are negative.      Objective:   Physical Exam Vitals reviewed.  Constitutional:      General: She is not in acute distress.    Appearance: She is well-developed. She is not diaphoretic.  Eyes:     General: No scleral icterus.    Conjunctiva/sclera: Conjunctivae normal.     Pupils: Pupils are equal, round, and reactive to light.  Neck:     Thyroid: No thyromegaly.     Vascular: No JVD.  Cardiovascular:     Rate and Rhythm: Normal rate and regular rhythm.     Heart sounds: Normal heart sounds. No murmur heard. No friction rub. No gallop.   Pulmonary:     Effort: Pulmonary effort is normal. No respiratory distress.     Breath sounds: Normal breath sounds. No wheezing or rales.  Abdominal:     General: Bowel sounds are normal.     Palpations: Abdomen is soft.  Musculoskeletal:     Cervical back: Neck supple.  Neurological:     Mental Status: She is  alert and oriented to person, place, and time.     Cranial Nerves: No cranial nerve deficit.     Motor: No abnormal muscle tone.     Coordination: Coordination normal.  Psychiatric:        Behavior: Behavior normal.        Thought Content: Thought content normal.        Judgment: Judgment normal.          Assessment & Plan:  General medical exam  Hypothyroidism, unspecified type  Primary insomnia  Elevated LFTs  I really feel that the patient would feel better if she could lose weight.  I believe that her fatigue and even some of her insomnia is related to stress.  I have recommended 30 minutes a day 5 days a week of vigorous aerobic exercise using a stationary bike.  I believe that coupled with the phentermine will achieve 10 to 15 pounds of weight loss over 3 months..  I believe that this would improve her self-confidence and self-esteem which I believe would help her overall outlook immensely.  I explained this to her.  I believe that her slightly elevated liver function test and blood sugar would both return to normal with 10 pounds of weight loss.  I also believe that her cholesterol would improve as well.  Her LDL cholesterol is elevated at over 130 however her HDL cholesterol is over 60.  We discussed statins however she has had elevated liver function test statins in the past and she wants to avoid that.  Therefore I did recommend 10 to  15 pounds of weight loss

## 2020-07-29 ENCOUNTER — Telehealth: Payer: Self-pay | Admitting: *Deleted

## 2020-07-29 MED ORDER — NIRMATRELVIR/RITONAVIR (PAXLOVID)TABLET: ORAL_TABLET | ORAL | 0 refills | Status: DC

## 2020-07-29 NOTE — Telephone Encounter (Signed)
Received call from patient.   Reports that patient spouse voiced C/O body aches, slight fever, HA, and cough x1 day. States that home COVID test + x2.   States that does not have sx at this time.  Given OTC advice for Sx management. Prescription sent to pharmacy for Paxlovid D/T patient being heavy smoker.   Advised if high fever noted that does not break with APAP/IBU, chest pain or SOB noted, patient is to go to ER for evaluation.

## 2020-08-01 ENCOUNTER — Encounter: Payer: Self-pay | Admitting: Family Medicine

## 2020-09-15 ENCOUNTER — Telehealth: Payer: Self-pay | Admitting: *Deleted

## 2020-09-15 NOTE — Telephone Encounter (Signed)
Patient due for Cologuard re-screen.   Order placed via [employer].   Cologuard (Order [id number])

## 2020-09-30 ENCOUNTER — Telehealth: Payer: Self-pay | Admitting: *Deleted

## 2020-09-30 NOTE — Telephone Encounter (Signed)
Received call from patient.   Reports that she is having possible sinus infection. States that she has been having Sx since 09/21/2020. Reports that she has sinus pressure, nasal congestion, slight HA, and has recently lost her taste and smell. Reports that she has nasal drainage that has changed to green and increased nausea.   Reports that she has tested at home for COVID x3 with all negative tests.   Also reports that she did have COVID in May 2022 and was treated with Paxlovid.   Advised to continue symptom management with OTC medications: Tylenol/ Ibuprofen for fever/ body aches, Mucinex/ Delsym for cough/ chest congestion, Afrin/Sudafed/nasal saline for sinus pressure/ nasal congestion.  Denies fever, SOB. Inquired if Z-Pack is appropriate.   Please advise.

## 2020-10-01 ENCOUNTER — Other Ambulatory Visit: Payer: Self-pay | Admitting: Family Medicine

## 2020-10-01 MED ORDER — AMOXICILLIN 875 MG PO TABS: 875.0000 mg | ORAL_TABLET | Freq: Two times a day (BID) | ORAL | 0 refills | Status: AC

## 2020-10-01 NOTE — Telephone Encounter (Signed)
Patient returned call and made aware.

## 2020-10-01 NOTE — Telephone Encounter (Signed)
Call placed to patient. LMTRC.  

## 2020-10-07 ENCOUNTER — Other Ambulatory Visit: Payer: Self-pay | Admitting: Family Medicine

## 2020-12-17 DIAGNOSIS — Z1212 Encounter for screening for malignant neoplasm of rectum: Secondary | ICD-10-CM | POA: Diagnosis not present

## 2020-12-17 DIAGNOSIS — Z1211 Encounter for screening for malignant neoplasm of colon: Secondary | ICD-10-CM | POA: Diagnosis not present

## 2020-12-17 LAB — COLOGUARD: Cologuard: NEGATIVE

## 2020-12-22 LAB — COLOGUARD: COLOGUARD: NEGATIVE

## 2020-12-23 NOTE — Telephone Encounter (Signed)
Received the results of Cologuard screening.   Screening noted negative.   A negative result indicates a low likelihood of colorectal cancer is present. Following a negative Cologuard result, the American Cancer Society recommends a Cologuard re-screening interval of 3 years.   Letter sent.   

## 2021-01-08 ENCOUNTER — Other Ambulatory Visit: Payer: Self-pay | Admitting: Family Medicine

## 2021-01-08 NOTE — Telephone Encounter (Signed)
Ok to refill??  Last office visit/ refill 07/20/2020.

## 2021-02-01 ENCOUNTER — Encounter: Payer: Self-pay | Admitting: Family Medicine

## 2021-02-03 ENCOUNTER — Other Ambulatory Visit: Payer: Self-pay

## 2021-02-03 ENCOUNTER — Other Ambulatory Visit: Payer: BC Managed Care – PPO

## 2021-02-03 DIAGNOSIS — Z136 Encounter for screening for cardiovascular disorders: Secondary | ICD-10-CM | POA: Diagnosis not present

## 2021-02-03 DIAGNOSIS — Z13220 Encounter for screening for lipoid disorders: Secondary | ICD-10-CM | POA: Diagnosis not present

## 2021-02-03 DIAGNOSIS — E039 Hypothyroidism, unspecified: Secondary | ICD-10-CM | POA: Diagnosis not present

## 2021-02-04 ENCOUNTER — Other Ambulatory Visit: Payer: Self-pay | Admitting: Family Medicine

## 2021-02-04 LAB — TSH: TSH: 0.42 mIU/L (ref 0.40–4.50)

## 2021-02-04 LAB — LIPID PANEL
Cholesterol: 212 mg/dL — ABNORMAL HIGH (ref <200)
HDL: 53 mg/dL (ref >=50)
LDL Cholesterol (Calc): 143 mg/dL (calc) — ABNORMAL HIGH
Non-HDL Cholesterol (Calc): 159 mg/dL (calc) — ABNORMAL HIGH (ref <130)
Total CHOL/HDL Ratio: 4 (calc) (ref <5.0)
Triglycerides: 72 mg/dL (ref <150)

## 2021-02-04 LAB — COMPLETE METABOLIC PANEL WITH GFR
AG Ratio: 2 (calc) (ref 1.0–2.5)
ALT: 32 U/L — ABNORMAL HIGH (ref 6–29)
AST: 24 U/L (ref 10–35)
Albumin: 4.5 g/dL (ref 3.6–5.1)
Alkaline phosphatase (APISO): 83 U/L (ref 37–153)
BUN: 15 mg/dL (ref 7–25)
CO2: 29 mmol/L (ref 20–32)
Calcium: 9.5 mg/dL (ref 8.6–10.4)
Chloride: 105 mmol/L (ref 98–110)
Creat: 0.74 mg/dL (ref 0.50–1.05)
Globulin: 2.2 g/dL (calc) (ref 1.9–3.7)
Glucose, Bld: 94 mg/dL (ref 65–99)
Potassium: 4.6 mmol/L (ref 3.5–5.3)
Sodium: 141 mmol/L (ref 135–146)
Total Bilirubin: 0.4 mg/dL (ref 0.2–1.2)
Total Protein: 6.7 g/dL (ref 6.1–8.1)
eGFR: 92 mL/min/{1.73_m2} (ref >=60)

## 2021-02-04 LAB — CBC WITH DIFFERENTIAL/PLATELET
Absolute Monocytes: 440 cells/uL (ref 200–950)
Basophils Absolute: 50 cells/uL (ref 0–200)
Basophils Relative: 1 %
Eosinophils Absolute: 140 cells/uL (ref 15–500)
Eosinophils Relative: 2.8 %
HCT: 42.9 % (ref 35.0–45.0)
Hemoglobin: 14.1 g/dL (ref 11.7–15.5)
Lymphs Abs: 1950 cells/uL (ref 850–3900)
MCH: 29.2 pg (ref 27.0–33.0)
MCHC: 32.9 g/dL (ref 32.0–36.0)
MCV: 88.8 fL (ref 80.0–100.0)
MPV: 10.5 fL (ref 7.5–12.5)
Monocytes Relative: 8.8 %
Neutro Abs: 2420 cells/uL (ref 1500–7800)
Neutrophils Relative %: 48.4 %
Platelets: 265 10*3/uL (ref 140–400)
RBC: 4.83 10*6/uL (ref 3.80–5.10)
RDW: 11.5 % (ref 11.0–15.0)
Total Lymphocyte: 39 %
WBC: 5 10*3/uL (ref 3.8–10.8)

## 2021-02-08 ENCOUNTER — Other Ambulatory Visit: Payer: Self-pay | Admitting: Family Medicine

## 2021-02-08 NOTE — Telephone Encounter (Signed)
PDMP reviewed.  Patient takes this medication chronically.  Refill given in place of PCP who is out of the office. 

## 2021-02-15 ENCOUNTER — Ambulatory Visit: Payer: BC Managed Care – PPO | Admitting: Family Medicine

## 2021-04-12 DIAGNOSIS — J029 Acute pharyngitis, unspecified: Secondary | ICD-10-CM | POA: Diagnosis not present

## 2021-04-12 DIAGNOSIS — R051 Acute cough: Secondary | ICD-10-CM | POA: Diagnosis not present

## 2021-06-07 ENCOUNTER — Other Ambulatory Visit: Payer: Self-pay | Admitting: Family Medicine

## 2021-06-07 DIAGNOSIS — Z1231 Encounter for screening mammogram for malignant neoplasm of breast: Secondary | ICD-10-CM | POA: Diagnosis not present

## 2021-07-18 ENCOUNTER — Encounter: Payer: Self-pay | Admitting: Emergency Medicine

## 2021-07-18 ENCOUNTER — Ambulatory Visit
Admission: EM | Admit: 2021-07-18 | Discharge: 2021-07-18 | Disposition: A | Discharge status: Home / Self Care | Payer: 59 | Attending: Urgent Care | Admitting: Urgent Care

## 2021-07-18 DIAGNOSIS — R070 Pain in throat: Secondary | ICD-10-CM

## 2021-07-18 DIAGNOSIS — R0982 Postnasal drip: Secondary | ICD-10-CM | POA: Diagnosis not present

## 2021-07-18 DIAGNOSIS — R052 Subacute cough: Secondary | ICD-10-CM | POA: Diagnosis not present

## 2021-07-18 DIAGNOSIS — R0981 Nasal congestion: Secondary | ICD-10-CM | POA: Diagnosis not present

## 2021-07-18 DIAGNOSIS — J309 Allergic rhinitis, unspecified: Secondary | ICD-10-CM

## 2021-07-18 DIAGNOSIS — J029 Acute pharyngitis, unspecified: Secondary | ICD-10-CM | POA: Diagnosis not present

## 2021-07-18 LAB — POCT RAPID STREP A (OFFICE): Rapid Strep A Screen: NEGATIVE

## 2021-07-18 MED ORDER — PROMETHAZINE-DM 6.25-15 MG/5ML PO SYRP: 5.0000 mL | ORAL_SOLUTION | Freq: Every evening | ORAL | 0 refills | Status: DC | PRN

## 2021-07-18 MED ORDER — LEVOCETIRIZINE DIHYDROCHLORIDE 5 MG PO TABS
5.0000 mg | ORAL_TABLET | Freq: Every evening | ORAL | 0 refills | Status: DC
Start: 2021-07-18 — End: 2022-02-16

## 2021-07-18 MED ORDER — BENZONATATE 100 MG PO CAPS: 100.0000 mg | ORAL_CAPSULE | Freq: Three times a day (TID) | ORAL | 0 refills | Status: DC | PRN

## 2021-07-18 MED ORDER — PREDNISONE 10 MG PO TABS: 30.0000 mg | ORAL_TABLET | Freq: Every day | ORAL | 0 refills | Status: DC

## 2021-07-18 NOTE — ED Provider Notes (Signed)
?[facility] ? ? ?MRN: [id number] DOB: 03-21-1961 ? ?Subjective:  ? ?Meghan Lozano is a 61 y.o. female presenting for 4-day history of acute onset throat pain, painful swallowing, congestion, fatigue, coughing.  Had strep exposure with her granddaughter who tested positive for this today.  Has been using Tylenol and ibuprofen.  Patient does have a history of allergic rhinitis and takes an oral antihistamine daily.  She also has a history of laryngitis.  No chest pain, shortness of breath or wheezing.  Patient is non-smoker. ? ?No current facility-administered medications for this encounter. ? ?Current Outpatient Medications:  ?  ALPRAZolam (XANAX) 0.5 MG tablet, TAKE 1 TABLET BY MOUTH 3 TIMES DAILY AS NEEDED., Disp: 30 tablet, Rfl: 0 ?  clobetasol cream (TEMOVATE) 0.05 %, Apply 1 application topically 2 (two) times daily., Disp: , Rfl:  ?  phentermine 37.5 MG capsule, Take 37.5 mg by mouth every morning., Disp: , Rfl:  ?  SYNTHROID 112 MCG tablet, TAKE 1 TABLET (112 MCG TOTAL) BY MOUTH DAILY., Disp: 30 tablet, Rfl: 3 ?  zolpidem (AMBIEN) 10 MG tablet, TAKE 1 TABLET BY MOUTH AT BEDTIME AS NEEDED FOR SLEEP, Disp: 30 tablet, Rfl: 2  ? ?Allergies  ?Allergen Reactions  ? Other   ? Betadine [Povidone Iodine] Swelling  ? Demerol Nausea And Vomiting  ? Fish Allergy Nausea And Vomiting  ? Shrimp [Shellfish Allergy] Nausea And Vomiting  ? Iodine Itching and Rash  ? ? ?Past Medical History:  ?Diagnosis Date  ? Anxiety   ? [employer] 07/18/2020  ? Arthritis   ? bilateral hands and neck  ? Elevated LFTs   ? Hyperlipidemia   ? Hypothyroidism   ? Hypothyroidism   ? Thyroid disease   ? [employer] 07/18/2020  ?  ? ?Past Surgical History:  ?Procedure Laterality Date  ? FUNCTIONAL ENDOSCOPIC SINUS SURGERY  1981  ? GANGLION CYST EXCISION Right 1975  ? wrist  ? TONSILLECTOMY AND ADENOIDECTOMY  1966  ? ? ?Family History  ?Problem Relation Age of Onset  ? Heart disease Mother   ? Diabetes Mother   ? Skin cancer Mother   ?  Arthritis Mother   ? Hypertension Mother   ? Hyperlipidemia Mother   ? Heart disease Father   ? Arthritis Father   ? Hypertension Father   ? Hyperlipidemia Father   ? Heart disease Paternal Uncle   ? Stroke Maternal Grandmother   ? Heart disease Maternal Grandfather   ? Cerebral aneurysm Paternal Grandmother   ? Heart disease Paternal Grandfather   ? ? ?Social History  ? ?Tobacco Use  ? Smoking status: Never  ? Smokeless tobacco: Never  ?Substance Use Topics  ? Alcohol use: Yes  ?  Alcohol/week: 1.0 standard drink  ?  Types: 1 Glasses of wine per week  ? Drug use: No  ? ? ?ROS ? ? ?Objective:  ? ?Vitals: ?BP (!) 158/91 (BP Location: Left Arm)   Pulse 83   Temp 98.2 ?F (36.8 ?C) (Oral)   Resp 18   Ht 5\' 2"  (1.575 m)   Wt 152 lb (68.9 kg)   SpO2 96%   BMI 27.80 kg/m?  ? ?Physical Exam ?Constitutional:   ?   General: She is not in acute distress. ?   Appearance: Normal appearance. She is well-developed and normal weight. She is not ill-appearing, toxic-appearing or diaphoretic.  ?HENT:  ?   Head: Normocephalic and atraumatic.  ?   Right Ear: Tympanic membrane, ear canal and external  ear normal. No drainage or tenderness. No middle ear effusion. There is no impacted cerumen. Tympanic membrane is not erythematous.  ?   Left Ear: Tympanic membrane, ear canal and external ear normal. No drainage or tenderness.  No middle ear effusion. There is no impacted cerumen. Tympanic membrane is not erythematous.  ?   Nose: Nose normal. No congestion or rhinorrhea.  ?   Mouth/Throat:  ?   Mouth: Mucous membranes are moist. No oral lesions.  ?   Pharynx: No pharyngeal swelling, oropharyngeal exudate, posterior oropharyngeal erythema or uvula swelling.  ?   Tonsils: No tonsillar exudate or tonsillar abscesses. 0 on the right. 0 on the left.  ?Eyes:  ?   General: No scleral icterus.    ?   Right eye: No discharge.     ?   Left eye: No discharge.  ?   Extraocular Movements: Extraocular movements intact.  ?   Right eye: Normal  extraocular motion.  ?   Left eye: Normal extraocular motion.  ?   Conjunctiva/sclera: Conjunctivae normal.  ?Cardiovascular:  ?   Rate and Rhythm: Normal rate.  ?Pulmonary:  ?   Effort: Pulmonary effort is normal.  ?Musculoskeletal:  ?   Cervical back: Normal range of motion and neck supple.  ?Lymphadenopathy:  ?   Cervical: No cervical adenopathy.  ?Skin: ?   General: Skin is warm and dry.  ?Neurological:  ?   General: No focal deficit present.  ?   Mental Status: She is alert and oriented to person, place, and time.  ?Psychiatric:     ?   Mood and Affect: Mood normal.     ?   Behavior: Behavior normal.  ? ?Results for orders placed or performed during the hospital encounter of 07/18/21 (from the past 24 hour(s))  ?POCT rapid strep A     Status: None  ? Collection Time: 07/18/21 11:54 AM  ?Result Value Ref Range  ? Rapid Strep A Screen Negative Negative  ? ? ?Assessment and Plan :  ? ?PDMP not reviewed this encounter. ? ?1. Acute pharyngitis, unspecified etiology   ?2. Throat pain   ?3. Nasal congestion   ?4. Post-nasal drainage   ?5. Subacute cough   ?6. Allergic rhinitis, unspecified seasonality, unspecified trigger   ? ?Deferred imaging given clear cardiopulmonary exam, hemodynamically stable vital signs.  Patient requested aggressive management, will use prednisone in the context of her history of laryngitis and allergic rhinitis. Strep culture pending.  Use supportive care otherwise for an acute viral pharyngitis.  Counseled patient on potential for adverse effects with medications prescribed/recommended today, ER and return-to-clinic precautions discussed, patient verbalized understanding. ? ?  ?Wallis Bamberg, PA-C ?07/18/21 1203 ? ?

## 2021-07-18 NOTE — ED Triage Notes (Signed)
Patient c/o sore throat x 4 days, congestion, fatigue, granddaughter tested positive for strep today.  Patient has taken Tylenol and Ibuprofen. ?

## 2021-07-21 LAB — CULTURE, GROUP A STREP (THRC)

## 2021-08-11 DIAGNOSIS — J0100 Acute maxillary sinusitis, unspecified: Secondary | ICD-10-CM | POA: Diagnosis not present

## 2021-08-11 DIAGNOSIS — Z20828 Contact with and (suspected) exposure to other viral communicable diseases: Secondary | ICD-10-CM | POA: Diagnosis not present

## 2021-09-08 DIAGNOSIS — E559 Vitamin D deficiency, unspecified: Secondary | ICD-10-CM | POA: Diagnosis not present

## 2021-09-08 DIAGNOSIS — Z760 Encounter for issue of repeat prescription: Secondary | ICD-10-CM | POA: Diagnosis not present

## 2021-09-08 DIAGNOSIS — Z01419 Encounter for gynecological examination (general) (routine) without abnormal findings: Secondary | ICD-10-CM | POA: Diagnosis not present

## 2021-09-08 DIAGNOSIS — Z6828 Body mass index (BMI) 28.0-28.9, adult: Secondary | ICD-10-CM | POA: Diagnosis not present

## 2021-09-08 DIAGNOSIS — R635 Abnormal weight gain: Secondary | ICD-10-CM | POA: Diagnosis not present

## 2021-09-08 DIAGNOSIS — G4700 Insomnia, unspecified: Secondary | ICD-10-CM | POA: Diagnosis not present

## 2021-09-08 DIAGNOSIS — L659 Nonscarring hair loss, unspecified: Secondary | ICD-10-CM | POA: Diagnosis not present

## 2021-09-08 DIAGNOSIS — Z124 Encounter for screening for malignant neoplasm of cervix: Secondary | ICD-10-CM | POA: Diagnosis not present

## 2022-01-03 ENCOUNTER — Ambulatory Visit: Payer: 59 | Admitting: Family Medicine

## 2022-01-03 VITALS — BP 148/92 | HR 72 | Wt 153.0 lb

## 2022-01-03 DIAGNOSIS — R030 Elevated blood-pressure reading, without diagnosis of hypertension: Secondary | ICD-10-CM

## 2022-01-03 MED ORDER — VALSARTAN 160 MG PO TABS: 160.0000 mg | ORAL_TABLET | Freq: Every day | ORAL | 3 refills | Status: DC

## 2022-01-03 MED ORDER — ROSUVASTATIN CALCIUM 5 MG PO TABS: 5.0000 mg | ORAL_TABLET | Freq: Every day | ORAL | 3 refills | Status: DC

## 2022-01-03 NOTE — Progress Notes (Signed)
Subjective:    Patient ID: Meghan Lozano, female    DOB: August 31, 1960, 61 y.o.   MRN: [id number]  Hypertension    Patient has recently noticed her blood pressure being elevated.  Her blood pressures between 145 and 165/98-100.  She states that with activity she does feel some heaviness in her chest occasionally and early fatigability but she denies any overt chest pain.  She denies any pleurisy or hemoptysis.  She denies any shortness of breath at rest.  She denies any orthopnea or paroxysmal nocturnal dyspnea.  She denies any change in her diet.  She is not taking any weight loss supplements or Sudafed.  Past Medical History:  Diagnosis Date   Anxiety    [employer] 07/18/2020   Arthritis    bilateral hands and neck   Elevated LFTs    Hyperlipidemia    Hypothyroidism    Hypothyroidism    Thyroid disease    [employer] 07/18/2020   Past Surgical History:  Procedure Laterality Date   FUNCTIONAL ENDOSCOPIC SINUS SURGERY  1981   GANGLION CYST EXCISION Right 1975   wrist   TONSILLECTOMY AND ADENOIDECTOMY  1966   Current Outpatient Medications on File Prior to Visit  Medication Sig Dispense Refill   ALPRAZolam (XANAX) 0.5 MG tablet TAKE 1 TABLET BY MOUTH 3 TIMES DAILY AS NEEDED. 30 tablet 0   benzonatate (TESSALON) 100 MG capsule Take 1-2 capsules (100-200 mg total) by mouth 3 (three) times daily as needed for cough. 60 capsule 0   clobetasol cream (TEMOVATE) 0.05 % Apply 1 application topically 2 (two) times daily.     levocetirizine (XYZAL) 5 MG tablet Take 1 tablet (5 mg total) by mouth every evening. 30 tablet 0   phentermine 37.5 MG capsule Take 37.5 mg by mouth every morning.     predniSONE (DELTASONE) 10 MG tablet Take 3 tablets (30 mg total) by mouth daily with breakfast. 15 tablet 0   promethazine-dextromethorphan (PROMETHAZINE-DM) 6.25-15 MG/5ML syrup Take 5 mLs by mouth at bedtime as needed for cough. 100 mL 0   SYNTHROID 112 MCG tablet TAKE 1 TABLET (112 MCG TOTAL) BY MOUTH  DAILY. 30 tablet 3   zolpidem (AMBIEN) 10 MG tablet TAKE 1 TABLET BY MOUTH AT BEDTIME AS NEEDED FOR SLEEP 30 tablet 2   No current facility-administered medications on file prior to visit.   Allergies  Allergen Reactions   Other    Betadine [Povidone Iodine] Swelling   Demerol Nausea And Vomiting   Fish Allergy Nausea And Vomiting   Shrimp [Shellfish Allergy] Nausea And Vomiting   Iodine Itching and Rash   Social History   Socioeconomic History   Marital status: Married    Spouse name: Not on file   Number of children: Not on file   Years of education: Not on file   Highest education level: Not on file  Occupational History   Not on file  Tobacco Use   Smoking status: Never   Smokeless tobacco: Never  Substance and Sexual Activity   Alcohol use: Yes    Alcohol/week: 1.0 standard drink of alcohol    Types: 1 Glasses of wine per week   Drug use: No   Sexual activity: Not on file    Comment: married to August Saucer,  Other Topics Concern   Not on file  Social History Narrative   Not on file   Social Determinants of Health   Financial Resource Strain: Not on file  Food Insecurity: Not on file  Transportation Needs: Not on file  Physical Activity: Not on file  Stress: Not on file  Social Connections: Not on file  Intimate Partner Violence: Not on file      Review of Systems  All other systems reviewed and are negative.      Objective:   Physical Exam Vitals reviewed.  Constitutional:      General: She is not in acute distress.    Appearance: She is well-developed. She is not diaphoretic.  Eyes:     General: No scleral icterus.    Conjunctiva/sclera: Conjunctivae normal.     Pupils: Pupils are equal, round, and reactive to light.  Neck:     Thyroid: No thyromegaly.     Vascular: No JVD.  Cardiovascular:     Rate and Rhythm: Normal rate and regular rhythm.     Heart sounds: Normal heart sounds. No murmur heard.    No friction rub. No gallop.  Pulmonary:      Effort: Pulmonary effort is normal. No respiratory distress.     Breath sounds: Normal breath sounds. No wheezing or rales.  Abdominal:     General: Bowel sounds are normal.     Palpations: Abdomen is soft.  Musculoskeletal:     Cervical back: Neck supple.  Neurological:     Mental Status: She is alert and oriented to person, place, and time.     Cranial Nerves: No cranial nerve deficit.     Motor: No abnormal muscle tone.     Coordination: Coordination normal.  Psychiatric:        Behavior: Behavior normal.        Thought Content: Thought content normal.        Judgment: Judgment normal.          Assessment & Plan:  Elevated blood pressure reading - Plan: CBC with Differential/Platelet, COMPLETE METABOLIC PANEL WITH GFR, TSH I reviewed her cholesterol from her physical with her gynecologist.  Her LDL cholesterol was over 190.  I recommended taking Crestor.  She has a history of elevated liver function test and myalgias to pravastatin so she would like to try low-dose Crestor 5 mg daily and recheck her cholesterol in 6 to 8 weeks.  I am concerned about her blood pressure I want her to start taking Diovan 80 mg a day.  Recheck blood pressure in 1 week and if elevated still at that point I would increase to 160 mg.  If she continues to have some heaviness in her chest with activity once we bring her blood pressure down I would like to get her scheduled for stress test.  I will check a CBC a CMP and a TSH

## 2022-01-04 LAB — COMPLETE METABOLIC PANEL WITH GFR
AG Ratio: 1.9 (calc) (ref 1.0–2.5)
ALT: 40 U/L — ABNORMAL HIGH (ref 6–29)
AST: 23 U/L (ref 10–35)
Albumin: 4.7 g/dL (ref 3.6–5.1)
Alkaline phosphatase (APISO): 94 U/L (ref 37–153)
BUN: 19 mg/dL (ref 7–25)
CO2: 25 mmol/L (ref 20–32)
Calcium: 10 mg/dL (ref 8.6–10.4)
Chloride: 102 mmol/L (ref 98–110)
Creat: 0.81 mg/dL (ref 0.50–1.05)
Globulin: 2.5 g/dL (calc) (ref 1.9–3.7)
Glucose, Bld: 99 mg/dL (ref 65–99)
Potassium: 4.7 mmol/L (ref 3.5–5.3)
Sodium: 139 mmol/L (ref 135–146)
Total Bilirubin: 0.4 mg/dL (ref 0.2–1.2)
Total Protein: 7.2 g/dL (ref 6.1–8.1)
eGFR: 82 mL/min/{1.73_m2} (ref >=60)

## 2022-01-04 LAB — CBC WITH DIFFERENTIAL/PLATELET
Absolute Monocytes: 511 cells/uL (ref 200–950)
Basophils Absolute: 77 cells/uL (ref 0–200)
Basophils Relative: 1.1 %
Eosinophils Absolute: 196 cells/uL (ref 15–500)
Eosinophils Relative: 2.8 %
HCT: 44.1 % (ref 35.0–45.0)
Hemoglobin: 15 g/dL (ref 11.7–15.5)
Lymphs Abs: 2366 cells/uL (ref 850–3900)
MCH: 30 pg (ref 27.0–33.0)
MCHC: 34 g/dL (ref 32.0–36.0)
MCV: 88.2 fL (ref 80.0–100.0)
MPV: 10.6 fL (ref 7.5–12.5)
Monocytes Relative: 7.3 %
Neutro Abs: 3850 cells/uL (ref 1500–7800)
Neutrophils Relative %: 55 %
Platelets: 349 10*3/uL (ref 140–400)
RBC: 5 10*6/uL (ref 3.80–5.10)
RDW: 11.7 % (ref 11.0–15.0)
Total Lymphocyte: 33.8 %
WBC: 7 10*3/uL (ref 3.8–10.8)

## 2022-01-04 LAB — TSH: TSH: 3.05 mIU/L (ref 0.40–4.50)

## 2022-01-10 ENCOUNTER — Encounter: Payer: Self-pay | Admitting: Family Medicine

## 2022-01-28 DIAGNOSIS — D220 Melanocytic nevi of lip: Secondary | ICD-10-CM | POA: Diagnosis not present

## 2022-01-28 DIAGNOSIS — L814 Other melanin hyperpigmentation: Secondary | ICD-10-CM | POA: Diagnosis not present

## 2022-01-28 DIAGNOSIS — L821 Other seborrheic keratosis: Secondary | ICD-10-CM | POA: Diagnosis not present

## 2022-01-28 DIAGNOSIS — D485 Neoplasm of uncertain behavior of skin: Secondary | ICD-10-CM | POA: Diagnosis not present

## 2022-01-28 DIAGNOSIS — L820 Inflamed seborrheic keratosis: Secondary | ICD-10-CM | POA: Diagnosis not present

## 2022-01-28 DIAGNOSIS — L649 Androgenic alopecia, unspecified: Secondary | ICD-10-CM | POA: Diagnosis not present

## 2022-01-31 DIAGNOSIS — E039 Hypothyroidism, unspecified: Secondary | ICD-10-CM | POA: Diagnosis not present

## 2022-01-31 DIAGNOSIS — E559 Vitamin D deficiency, unspecified: Secondary | ICD-10-CM | POA: Diagnosis not present

## 2022-01-31 DIAGNOSIS — E7800 Pure hypercholesterolemia, unspecified: Secondary | ICD-10-CM | POA: Diagnosis not present

## 2022-01-31 DIAGNOSIS — R7989 Other specified abnormal findings of blood chemistry: Secondary | ICD-10-CM | POA: Diagnosis not present

## 2022-01-31 DIAGNOSIS — E538 Deficiency of other specified B group vitamins: Secondary | ICD-10-CM | POA: Diagnosis not present

## 2022-02-04 ENCOUNTER — Other Ambulatory Visit: Payer: Self-pay | Admitting: Family Medicine

## 2022-02-04 ENCOUNTER — Encounter: Payer: Self-pay | Admitting: Family Medicine

## 2022-02-04 DIAGNOSIS — R0609 Other forms of dyspnea: Secondary | ICD-10-CM

## 2022-02-16 ENCOUNTER — Encounter: Payer: Self-pay | Admitting: Cardiovascular Disease

## 2022-02-16 ENCOUNTER — Ambulatory Visit: Payer: 59 | Attending: Cardiovascular Disease | Admitting: Cardiovascular Disease

## 2022-02-16 DIAGNOSIS — R072 Precordial pain: Secondary | ICD-10-CM | POA: Diagnosis not present

## 2022-02-16 DIAGNOSIS — E7800 Pure hypercholesterolemia, unspecified: Secondary | ICD-10-CM | POA: Diagnosis not present

## 2022-02-16 DIAGNOSIS — E039 Hypothyroidism, unspecified: Secondary | ICD-10-CM

## 2022-02-16 DIAGNOSIS — I10 Essential (primary) hypertension: Secondary | ICD-10-CM | POA: Diagnosis not present

## 2022-02-16 DIAGNOSIS — R0609 Other forms of dyspnea: Secondary | ICD-10-CM

## 2022-02-16 DIAGNOSIS — Z8249 Family history of ischemic heart disease and other diseases of the circulatory system: Secondary | ICD-10-CM | POA: Diagnosis not present

## 2022-02-16 MED ORDER — ROSUVASTATIN CALCIUM 10 MG PO TABS: 10.0000 mg | ORAL_TABLET | Freq: Every day | ORAL | 1 refills | Status: DC

## 2022-02-16 MED ORDER — METOPROLOL SUCCINATE ER 25 MG PO TB24: 12.5000 mg | ORAL_TABLET | Freq: Every day | ORAL | 3 refills | Status: DC

## 2022-02-16 MED ORDER — METOPROLOL TARTRATE 100 MG PO TABS: 100.0000 mg | ORAL_TABLET | Freq: Once | ORAL | 0 refills | Status: DC

## 2022-02-16 NOTE — Progress Notes (Signed)
Cardiology Office Note    Date:  02/25/2022   ID:  Meghan Lozano, DOB [birth year], MRN [id number]  PCP:  Donita Brooks, MD  Cardiologist:  Nicki Guadalajara, MD   New cardiology evaluation referred through the courtesy of Dr. Lynnea Ferrier   History of Present Illness:  Meghan Lozano is a 61 y.o. female who is followed by Dr. Tanya Nones for primary care.  She has a history of hypothyroidism and has been on levothyroxine replacement since 2009.  She is the daughter of my patients [name] and [name]. She was recently seen by Dr. Ocie Cornfield at [facility] physicians for endocrinologic evaluation.  She underwent laboratory on January 31, 2022 which showed a total cholesterol 191, HDL 56, LDL 117, triglycerides 107.  In October 2023 TSH was 3.05.  She had normal renal function with creatinine 0.81.  Recently, she has has noticed development of shortness of breath which has progressed and is evident particularly while walking up steps.  She does not have much energy.  Recently, she has had labile blood pressure with blood pressure elevating to 165/110 in October 2023.  She states that that was the first she became aware of blood pressure elevation.  At that time she was started on low-dose valsartan 80 mg and also rosuvastatin 5 mg.  She continues to be on levothyroxine 112 mcg.  She admits to some weight gain.  She denies any presyncope or syncope.  She is unaware of any palpitations.  She denies orthostatic symptoms but at times has noted some vague chest heaviness associated with her shortness of breath and pain in her shoulder.  With her recent symptomatology, she is now referred for cardiology evaluation.   Past Medical History:  Diagnosis Date   Anxiety    Phreesia 07/18/2020   Arthritis    bilateral hands and neck   Elevated LFTs    Hyperlipidemia    Hypertension 12/2022   Hypothyroidism    Hypothyroidism    Thyroid disease    Phreesia 07/18/2020    Past Surgical History:   Procedure Laterality Date   FUNCTIONAL ENDOSCOPIC SINUS SURGERY  1981   GANGLION CYST EXCISION Right 1975   wrist   TONSILLECTOMY AND ADENOIDECTOMY  1966    Current Medications: Outpatient Medications Prior to Visit  Medication Sig Dispense Refill   valsartan (DIOVAN) 160 MG tablet Take 80 mg by mouth daily. Take 1/2  tablet by mouth daily     zolpidem (AMBIEN) 10 MG tablet Take 5 mg by mouth at bedtime as needed for sleep.     ALPRAZolam (XANAX) 0.5 MG tablet TAKE 1 TABLET BY MOUTH 3 TIMES DAILY AS NEEDED. 30 tablet 0   SYNTHROID 112 MCG tablet TAKE 1 TABLET (112 MCG TOTAL) BY MOUTH DAILY. 30 tablet 3   benzonatate (TESSALON) 100 MG capsule Take 1-2 capsules (100-200 mg total) by mouth 3 (three) times daily as needed for cough. 60 capsule 0   clobetasol cream (TEMOVATE) 0.05 % Apply 1 application topically 2 (two) times daily.     levocetirizine (XYZAL) 5 MG tablet Take 1 tablet (5 mg total) by mouth every evening. 30 tablet 0   phentermine 37.5 MG capsule Take 37.5 mg by mouth every morning. (Patient not taking: Reported on 01/03/2022)     predniSONE (DELTASONE) 10 MG tablet Take 3 tablets (30 mg total) by mouth daily with breakfast. (Patient not taking: Reported on 01/03/2022) 15 tablet 0   promethazine-dextromethorphan (PROMETHAZINE-DM)  6.25-15 MG/5ML syrup Take 5 mLs by mouth at bedtime as needed for cough. 100 mL 0   rosuvastatin (CRESTOR) 5 MG tablet Take 1 tablet (5 mg total) by mouth daily. 90 tablet 3   valsartan (DIOVAN) 160 MG tablet Take 1 tablet (160 mg total) by mouth daily. (Patient taking differently: Take 80 mg by mouth daily.) 90 tablet 3   zolpidem (AMBIEN) 10 MG tablet TAKE 1 TABLET BY MOUTH AT BEDTIME AS NEEDED FOR SLEEP (Patient taking differently: Take 5 mg by mouth. TAKE 1/2 TABLET BY MOUTH AT BEDTIME AS NEEDED FOR SLEEP) 30 tablet 2   No facility-administered medications prior to visit.     Allergies:   Other, Betadine [povidone iodine], Demerol, Fish allergy,  Shrimp [shellfish allergy], and Iodine   Social History   Socioeconomic History   Marital status: Married    Spouse name: Not on file   Number of children: Not on file   Years of education: Not on file   Highest education level: Not on file  Occupational History   Not on file  Tobacco Use   Smoking status: Never   Smokeless tobacco: Never  Substance and Sexual Activity   Alcohol use: Yes    Alcohol/week: 3.0 standard drinks of alcohol    Types: 3 Glasses of wine per week    Comment: Social   Drug use: No   Sexual activity: Not Currently    Birth control/protection: Abstinence    Comment: married to [employer],  Other Topics Concern   Not on file  Social History Narrative   Not on file   Social Determinants of Health   Financial Resource Strain: Not on file  Food Insecurity: Not on file  Transportation Needs: Not on file  Physical Activity: Not on file  Stress: Not on file  Social Connections: Not on file    Social history is notable and that she is married for 42 years.  She has a 44 year old son and a 62 year old daughter who are healthy.  There is no tobacco history.  She drinks occasional Hall.  She stays home caring caring for her 4 grandchildren.  She went to [employer].  Family History:  The patient's family history includes Arthritis in her father and mother; Cerebral aneurysm in her paternal grandmother; Diabetes in her mother; Heart attack in her father; Heart disease in her father, maternal grandfather, mother, paternal grandfather, and paternal uncle; Hyperlipidemia in her father and mother; Hypertension in her father, mother, and sister; Skin cancer in her mother; Stroke in her maternal grandmother.  Both parents have CAD.  Her father is 86 and mother 16.  She has 2 sisters who have a history of hypertension and hyperlipidemia.  ROS General: Negative; No fevers, chills, or night sweats;  HEENT: Negative; No changes in vision or hearing, sinus congestion, difficulty  swallowing Pulmonary: Negative; No cough, wheezing, shortness of breath, hemoptysis Cardiovascular: See HPI GI: Negative; No nausea, vomiting, diarrhea, or abdominal pain GU: Negative; No dysuria, hematuria, or difficulty voiding Musculoskeletal: Negative; no myalgias, joint pain, or weakness Hematologic/Oncology: Negative; no easy bruising, bleeding Endocrine: Negative; no heat/cold intolerance; no diabetes Neuro: Negative; no changes in balance, headaches Skin: Negative; No rashes or skin lesions Psychiatric: Negative; No behavioral problems, depression Sleep: Negative; No snoring, daytime sleepiness, hypersomnolence, bruxism, restless legs, hypnogognic hallucinations, no cataplexy Other comprehensive 14 point system review is negative.   PHYSICAL EXAM:   VS:  BP (!) 146/90   Pulse 79   Ht 5\' 2"  (  1.575 m)   Wt 160 lb 6.4 oz (72.8 kg)   SpO2 96%   BMI 29.34 kg/m     Repeat blood pressure by me was 140/82  Wt Readings from Last 3 Encounters:  02/16/22 160 lb 6.4 oz (72.8 kg)  01/03/22 153 lb (69.4 kg)  07/18/21 152 lb (68.9 kg)    General: Alert, oriented, no distress.  Skin: normal turgor, no rashes, warm and dry HEENT: Normocephalic, atraumatic. Pupils equal round and reactive to light; sclera anicteric; extraocular muscles intact;  Nose without nasal septal hypertrophy Mouth/Parynx benign; Mallinpatti scale 3 Neck: No JVD, no carotid bruits; normal carotid upstroke Lungs: clear to ausculatation and percussion; no wheezing or rales Chest wall: without tenderness to palpitation Heart: PMI not displaced, RRR, s1 s2 normal, 1/6 systolic murmur, no diastolic murmur, no rubs, gallops, thrills, or heaves Abdomen: soft, nontender; no hepatosplenomehaly, BS+; abdominal aorta nontender and not dilated by palpation. Back: no CVA tenderness Pulses 2+ Musculoskeletal: full range of motion, normal strength, no joint deformities Extremities: no clubbing cyanosis or edema, Homan's sign  negative  Neurologic: grossly nonfocal; Cranial nerves grossly wnl Psychologic: Normal mood and affect   Studies/Labs Reviewed:   February 16, 2022 ECG (independently read by me): NSR at 79, no ectopy  Recent Labs:    Latest Ref Rng & Units 01/03/2022    9:42 AM 02/03/2021    8:06 AM 07/14/2020    8:34 AM  BMP  Glucose 65 - 99 mg/dL 99  94  562   BUN 7 - 25 mg/dL Creatinine 0.50 - 1.05 mg/dL 1.30  8.65  7.84   BUN/Creat Ratio 6 - 22 (calc) SEE NOTE:  NOT APPLICABLE  NOT APPLICABLE   Sodium 135 - 146 mmol/L 139  141  139   Potassium 3.5 - 5.3 mmol/L 4.7  4.6  4.8   Chloride 98 - 110 mmol/L 102  105  105   CO2 20 - 32 mmol/L Calcium 8.6 - 10.4 mg/dL 69.6  9.5  9.4         Latest Ref Rng & Units 01/03/2022    9:42 AM 02/03/2021    8:06 AM 07/14/2020    8:34 AM  Hepatic Function  Total Protein 6.1 - 8.1 g/dL 7.2  6.7  6.5   AST 10 - 35 U/L 23  24  36   ALT 6 - 29 U/L 40  32  32   Total Bilirubin 0.2 - 1.2 mg/dL 0.4  0.4  0.3        Latest Ref Rng & Units 01/03/2022    9:42 AM 02/03/2021    8:06 AM 07/14/2020    8:34 AM  CBC  WBC 3.8 - 10.8 Thousand/uL 7.0  5.0  6.0   Hemoglobin 11.7 - 15.5 g/dL 29.5  28.4  13.2   Hematocrit 35.0 - 45.0 % 44.1  42.9  42.0   Platelets 140 - 400 Thousand/uL 349  265  310    Lab Results  Component Value Date   MCV 88.2 01/03/2022   MCV 88.8 02/03/2021   MCV 88.2 07/14/2020   Lab Results  Component Value Date   TSH 3.05 01/03/2022   No results found for: "HGBA1C"   BNP No results found for: "BNP"  ProBNP No results found for: "PROBNP"   Lipid Panel     Component Value Date/Time   CHOL 212 (H) 02/03/2021 4401  TRIG 72 02/03/2021 0806   HDL 53 02/03/2021 0806   CHOLHDL 4.0 02/03/2021 0806   VLDL 13 07/08/2016 0841   LDLCALC 143 (H) 02/03/2021 0806     RADIOLOGY: No results found.   Additional studies/ records that were reviewed today include:   I reviewed the records of Dr. Tanya Nones  and  Deboraha Sprang.   ASSESSMENT:    1. Precordial pain   2. Exertional dyspnea   3. Essential hypertension   4. Hypothyroidism, unspecified type   5. Family history of heart disease   6. Pure hypercholesterolemia     PLAN:  Ms. Deamber Buckhalter is a very pleasant 61 year old female who is followed by Dr. Tanya Nones for primary care and sees Dr. Garrison Columbus of GYN.  She has a history of hypothyroidism and has been on thyroid replacement since 2009.  In October 2023 she became aware of some blood pressure lability.  She was started on valsartan and also was started on rosuvastatin 5 mg.  Subsequent lab work on January 31, 2022 showed total cholesterol 191 HDL 56 triglycerides 107 LDL 117 and non-HDL 136.  She has experienced shortness of breath with activity and at times has also experienced some vague chest tightness and heaviness.  Her blood pressure today is elevated and her resting pulse is 79.  Presently, I am recommending she undergo a 2D echo Doppler study to assess LV systolic and diastolic function and valvular architecture.  With her significant family history for CAD including her father having undergone CABG revascularization and PCI and her mother having undergone coronary intervention I am recommending she undergo coronary CTA for assessment of calcium score and potential intraluminal coronary stenosis.  I am recommending she initiate metoprolol succinate 12.5 mg which would be helpful for blood pressure control as well as potential underlying CAD.  I have recommended she titrate her very low-dose rosuvastatin to at least 10 mg but I suspect this may need to be further titrated.  She has recently undergone endocrinologic evaluation and is on levothyroxine 112 mcg.  She will monitor her blood pressure.  I will contact her regarding the results of the above studies and see her in follow-up in 6 to 8 weeks for reevaluation.   Medication Adjustments/Labs and Tests Ordered: Current medicines are reviewed  at length with the patient today.  Concerns regarding medicines are outlined above.  Medication changes, Labs and Tests ordered today are listed in the Patient Instructions below. Patient Instructions  Medication Instructions:  Your physician has recommended you make the following change in your medication:   -Start taking metoprolol succinate (Toprol-XL) 12.5mg  once daily.  -Increase rosuvastatin (crestor) to  once daily.  *If you need a refill on your cardiac medications before your next appointment, please call your pharmacy*   Lab Work: Your physician recommends that you return for lab work in: prior to coronary CTA- BMET  Your physician recommends that you return for lab work in: 8 weeks for FASTING CMET, CBC, TSH, Lipids & LPA   If you have labs (blood work) drawn today and your tests are completely normal, you will receive your results only by: MyChart Message (if you have MyChart) OR A paper copy in the mail If you have any lab test that is abnormal or we need to change your treatment, we will call you to review the results.   Testing/Procedures: Your physician has requested that you have an echocardiogram. Echocardiography is a painless test that uses sound waves to create images of your  heart. It provides your doctor with information about the size and shape of your heart and how well your heart's chambers and valves are working. This procedure takes approximately one hour. There are no restrictions for this procedure. Please do NOT wear cologne, perfume, aftershave, or lotions (deodorant is allowed). Please arrive 15 minutes prior to your appointment time. This procedure will be done at [address]   Follow-Up: At [facility], you and your health needs are our priority.  As part of our continuing mission to provide you with exceptional heart care, we have created designated Provider Care Teams.  These Care Teams include your primary Cardiologist  (physician) and Advanced Practice Providers (APPs -  Physician Assistants and Nurse Practitioners) who all work together to [place] you with the care you need, when you need it.  We recommend signing up for the patient portal called "MyChart".  Sign up information is provided on this After Visit Summary.  MyChart is used to connect with patients for Virtual Visits (Telemedicine).  Patients are able to view lab/test results, encounter notes, upcoming appointments, etc.  Non-urgent messages can be sent to your provider as well.   To learn more about what you can do with MyChart, go to [email or web address].    Your next appointment:   8 week(s)  The format for your next appointment:   In Person  Provider:   Nicki Guadalajarahomas Deshae Dickison, MD   Other Instructions   Your cardiac CT will be scheduled at the below location:   [facility] [address] [place], [place] [address] [phone]   If scheduled at [facility], please arrive at the [facility] (Entrance C2) of [facility] 30 minutes prior to test start time. You can use the FREE valet parking offered at entrance C (encouraged to control the heart rate for the test)  Proceed to the [facility] Radiology Department (first floor) to check-in and test prep.  All radiology patients and guests should use entrance C2 at [facility], accessed from [facility], even though the hospital's physical address listed is [address].     Please follow these instructions carefully (unless otherwise directed):    On the Night Before the Test: Be sure to Drink plenty of water. Do not consume any caffeinated/decaffeinated beverages or chocolate 12 hours prior to your test. Do not take any antihistamines 12 hours prior to your test.  On the Day of the Test: Drink plenty of water until 1 hour prior to the test. Do not eat any food 1 hour prior to test. You may take your  regular medications prior to the test.  Hold Metoprolol succinate on day of procedure. Take metoprolol (Lopressor) 100mg  two hours prior to test. HOLD Furosemide/Hydrochlorothiazide morning of the test. FEMALES- please wear underwire-free bra if available, avoid dresses & tight clothing   After the Test: Drink plenty of water. After receiving IV contrast, you may experience a mild flushed feeling. This is normal. On occasion, you may experience a mild rash up to 24 hours after the test. This is not dangerous. If this occurs, you can take Benadryl 25 mg and increase your fluid intake. If you experience trouble breathing, this can be serious. If it is severe call 911 IMMEDIATELY. If it is mild, please call our office. If you take any of these medications: Glipizide/Metformin, Avandament, Glucavance, please do not take 48 hours after completing test unless otherwise instructed.  We will call to schedule your test 2-4 weeks out understanding that some insurance companies will need an authorization prior to the service being performed.   For non-scheduling related questions, please contact the cardiac imaging nurse navigator should you have any questions/concerns: Rockwell Alexandria, Cardiac Imaging Nurse Navigator Larey Brick, Cardiac Imaging Nurse Navigator [facility] Direct Office Dial: [phone]   For scheduling needs, including cancellations and rescheduling, please call [place], [phone].    Signed, Nicki Guadalajara, MD  02/25/2022 3:34 PM    [facility] [address], [address], [place], [place]  [address] Phone: [phone]

## 2022-02-16 NOTE — Patient Instructions (Addendum)
Medication Instructions:  Your physician has recommended you make the following change in your medication:   -Start taking metoprolol succinate (Toprol-XL) 12.5mg  once daily.  -Increase rosuvastatin (crestor) to 10mg  once daily.  *If you need a refill on your cardiac medications before your next appointment, please call your pharmacy*   Lab Work: Your physician recommends that you return for lab work in: prior to coronary CTA- BMET  Your physician recommends that you return for lab work in: 8 weeks for FASTING CMET, CBC, TSH, Lipids & LPA   If you have labs (blood work) drawn today and your tests are completely normal, you will receive your results only by: MyChart Message (if you have MyChart) OR A paper copy in the mail If you have any lab test that is abnormal or we need to change your treatment, we will call you to review the results.   Testing/Procedures: Your physician has requested that you have an echocardiogram. Echocardiography is a painless test that uses sound waves to create images of your heart. It provides your doctor with information about the size and shape of your heart and how well your heart's chambers and valves are working. This procedure takes approximately one hour. There are no restrictions for this procedure. Please do NOT wear cologne, perfume, aftershave, or lotions (deodorant is allowed). Please arrive 15 minutes prior to your appointment time. This procedure will be done at [address]   Follow-Up: At [facility], you and your health needs are our priority.  As part of our continuing mission to provide you with exceptional heart care, we have created designated Provider Care Teams.  These Care Teams include your primary Cardiologist (physician) and Advanced Practice Providers (APPs -  Physician Assistants and Nurse Practitioners) who all work together to provide you with the care you need, when you need it.  We recommend signing  up for the patient portal called "MyChart".  Sign up information is provided on this After Visit Summary.  MyChart is used to connect with patients for Virtual Visits (Telemedicine).  Patients are able to view lab/test results, encounter notes, upcoming appointments, etc.  Non-urgent messages can be sent to your provider as well.   To learn more about what you can do with MyChart, go to [facility].    Your next appointment:   8 week(s)  The format for your next appointment:   In Person  Provider:   [email or web address], MD   Other Instructions   Your cardiac CT will be scheduled at the below location:   [facility] [address] [place], [place] [phone]   If scheduled at [facility], please arrive at the [facility] and Children's Entrance (Entrance C2) of [facility] 30 minutes prior to test start time. You can use the FREE valet parking offered at entrance C (encouraged to control the heart rate for the test)  Proceed to the [facility] Radiology Department (first floor) to check-in and test prep.  All radiology patients and guests should use entrance C2 at [facility], accessed from [facility], even though the hospital's physical address listed is [address]     Please follow these instructions carefully (unless otherwise directed):    On the Night Before the Test: Be sure to Drink plenty of water. Do not consume any caffeinated/decaffeinated beverages or chocolate 12 hours prior to your test. Do not take any antihistamines 12 hours prior to your  test.  On the Day of the Test: Drink plenty of water until 1 hour prior to the test. Do not eat any food 1 hour prior to test. You may take your regular medications prior to the test.  Hold Metoprolol succinate on day of procedure. Take metoprolol (Lopressor) 100mg  two hours prior to test. HOLD Furosemide/Hydrochlorothiazide morning of the  test. FEMALES- please wear underwire-free bra if available, avoid dresses & tight clothing   After the Test: Drink plenty of water. After receiving IV contrast, you may experience a mild flushed feeling. This is normal. On occasion, you may experience a mild rash up to 24 hours after the test. This is not dangerous. If this occurs, you can take Benadryl 25 mg and increase your fluid intake. If you experience trouble breathing, this can be serious. If it is severe call 911 IMMEDIATELY. If it is mild, please call our office. If you take any of these medications: Glipizide/Metformin, Avandament, Glucavance, please do not take 48 hours after completing test unless otherwise instructed.  We will call to schedule your test 2-4 weeks out understanding that some insurance companies will need an authorization prior to the service being performed.   For non-scheduling related questions, please contact the cardiac imaging nurse navigator should you have any questions/concerns: , Cardiac Imaging Nurse Navigator Rockwell Alexandria, Cardiac Imaging Nurse Navigator [facility] Direct Office Dial: [phone]   For scheduling needs, including cancellations and rescheduling, please call [phone], [phone].

## 2022-02-25 ENCOUNTER — Encounter: Payer: Self-pay | Admitting: Cardiovascular Disease

## 2022-02-28 DIAGNOSIS — R072 Precordial pain: Secondary | ICD-10-CM | POA: Diagnosis not present

## 2022-03-01 ENCOUNTER — Telehealth (HOSPITAL_COMMUNITY): Payer: Self-pay | Admitting: *Deleted

## 2022-03-01 LAB — BASIC METABOLIC PANEL
BUN/Creatinine Ratio: 21 (ref 12–28)
BUN: 17 mg/dL (ref 8–27)
CO2: 26 mmol/L (ref 20–29)
Calcium: 9.5 mg/dL (ref 8.7–10.3)
Chloride: 104 mmol/L (ref 96–106)
Creatinine, Ser: 0.8 mg/dL (ref 0.57–1.00)
Glucose: 102 mg/dL — ABNORMAL HIGH (ref 70–99)
Potassium: 4.7 mmol/L (ref 3.5–5.2)
Sodium: 139 mmol/L (ref 134–144)
eGFR: 83 mL/min/{1.73_m2} (ref >59)

## 2022-03-01 NOTE — Telephone Encounter (Signed)
Reaching out to patient to offer assistance regarding upcoming cardiac imaging study; pt verbalizes understanding of appt date/time, parking situation and where to check in, pre-test NPO status and medications ordered, and verified current allergies; name and call back number provided for further questions should they arise  Larey Brick RN Navigator Cardiac Imaging [facility] [phone] office [phone] cell  Patient reports only an topical iodine allergy. She does not recall having an CT scan with contrast in the past nor is there a record of one within the [facility] system. She is to take 100mg  metoprolol tartrate two hours prior to her cardiac CT scan and is aware to arrive at 8:30am.

## 2022-03-02 ENCOUNTER — Ambulatory Visit (HOSPITAL_COMMUNITY)
Admission: RE | Admit: 2022-03-02 | Discharge: 2022-03-02 | Disposition: A | Discharge status: Home / Self Care | Payer: 59 | Source: Ambulatory Visit | Attending: Cardiovascular Disease | Admitting: Cardiovascular Disease

## 2022-03-02 ENCOUNTER — Other Ambulatory Visit: Payer: Self-pay | Admitting: Cardiovascular Disease

## 2022-03-02 ENCOUNTER — Ambulatory Visit (HOSPITAL_BASED_OUTPATIENT_CLINIC_OR_DEPARTMENT_OTHER)
Admission: RE | Admit: 2022-03-02 | Discharge: 2022-03-02 | Disposition: A | Discharge status: Home / Self Care | Payer: 59 | Source: Ambulatory Visit | Attending: Cardiovascular Disease | Admitting: Cardiovascular Disease

## 2022-03-02 DIAGNOSIS — R931 Abnormal findings on diagnostic imaging of heart and coronary circulation: Secondary | ICD-10-CM | POA: Diagnosis not present

## 2022-03-02 DIAGNOSIS — I2510 Atherosclerotic heart disease of native coronary artery without angina pectoris: Secondary | ICD-10-CM | POA: Diagnosis not present

## 2022-03-02 DIAGNOSIS — R072 Precordial pain: Secondary | ICD-10-CM | POA: Diagnosis present

## 2022-03-02 MED ORDER — NITROGLYCERIN 0.4 MG SL SUBL
0.8000 mg | SUBLINGUAL_TABLET | Freq: Once | SUBLINGUAL | Status: AC
  Administered 2022-03-02: 0.8 mg via SUBLINGUAL

## 2022-03-02 MED ORDER — NITROGLYCERIN 0.4 MG SL SUBL
SUBLINGUAL_TABLET | SUBLINGUAL | Status: AC
  Filled 2022-03-02: qty 2

## 2022-03-02 MED ORDER — IOHEXOL 350 MG/ML SOLN
95.0000 mL | Freq: Once | INTRAVENOUS | Status: AC | PRN
  Administered 2022-03-02: 95 mL via INTRAVENOUS

## 2022-03-08 ENCOUNTER — Encounter: Payer: Self-pay | Admitting: Cardiovascular Disease

## 2022-03-11 ENCOUNTER — Ambulatory Visit (HOSPITAL_COMMUNITY): Payer: 59 | Attending: Cardiovascular Disease

## 2022-03-11 DIAGNOSIS — R072 Precordial pain: Secondary | ICD-10-CM

## 2022-03-11 LAB — ECHOCARDIOGRAM COMPLETE
Area-P 1/2: 4.02 cm2
S' Lateral: 1.9 cm

## 2022-04-11 LAB — CBC

## 2022-04-12 LAB — CBC
Hematocrit: 41.1 % (ref 34.0–46.6)
Hemoglobin: 13.6 g/dL (ref 11.1–15.9)
MCH: 29.3 pg (ref 26.6–33.0)
MCHC: 33.1 g/dL (ref 31.5–35.7)
MCV: 89 fL (ref 79–97)
Platelets: 283 10*3/uL (ref 150–450)
RBC: 4.64 x10E6/uL (ref 3.77–5.28)
RDW: 11.4 % — ABNORMAL LOW (ref 11.7–15.4)
WBC: 5.7 10*3/uL (ref 3.4–10.8)

## 2022-04-12 LAB — COMPREHENSIVE METABOLIC PANEL
ALT: 39 IU/L — ABNORMAL HIGH (ref 0–32)
AST: 26 IU/L (ref 0–40)
Albumin/Globulin Ratio: 2.3 — ABNORMAL HIGH (ref 1.2–2.2)
Albumin: 4.6 g/dL (ref 3.9–4.9)
Alkaline Phosphatase: 84 IU/L (ref 44–121)
BUN/Creatinine Ratio: 17 (ref 12–28)
BUN: 13 mg/dL (ref 8–27)
Bilirubin Total: 0.3 mg/dL (ref 0.0–1.2)
CO2: 25 mmol/L (ref 20–29)
Calcium: 9.5 mg/dL (ref 8.7–10.3)
Chloride: 105 mmol/L (ref 96–106)
Creatinine, Ser: 0.77 mg/dL (ref 0.57–1.00)
Globulin, Total: 2 g/dL (ref 1.5–4.5)
Glucose: 94 mg/dL (ref 70–99)
Potassium: 4.7 mmol/L (ref 3.5–5.2)
Sodium: 143 mmol/L (ref 134–144)
Total Protein: 6.6 g/dL (ref 6.0–8.5)
eGFR: 87 mL/min/{1.73_m2} (ref >59)

## 2022-04-12 LAB — LIPID PANEL
Chol/HDL Ratio: 3.1 ratio (ref 0.0–4.4)
Cholesterol, Total: 157 mg/dL (ref 100–199)
HDL: 50 mg/dL (ref >39)
LDL Chol Calc (NIH): 93 mg/dL (ref 0–99)
Triglycerides: 70 mg/dL (ref 0–149)
VLDL Cholesterol Cal: 14 mg/dL (ref 5–40)

## 2022-04-12 LAB — TSH: TSH: 0.121 u[IU]/mL — ABNORMAL LOW (ref 0.450–4.500)

## 2022-04-12 LAB — LIPOPROTEIN A (LPA): Lipoprotein (a): 278.1 nmol/L — ABNORMAL HIGH (ref <75.0)

## 2022-04-13 ENCOUNTER — Ambulatory Visit: Payer: 59 | Attending: Cardiovascular Disease | Admitting: Cardiovascular Disease

## 2022-04-13 ENCOUNTER — Encounter: Payer: Self-pay | Admitting: Cardiovascular Disease

## 2022-04-13 VITALS — BP 130/88 | HR 73 | Ht 62.0 in | Wt 158.2 lb

## 2022-04-13 DIAGNOSIS — I10 Essential (primary) hypertension: Secondary | ICD-10-CM | POA: Diagnosis not present

## 2022-04-13 DIAGNOSIS — E7841 Elevated Lipoprotein(a): Secondary | ICD-10-CM

## 2022-04-13 DIAGNOSIS — K449 Diaphragmatic hernia without obstruction or gangrene: Secondary | ICD-10-CM | POA: Diagnosis not present

## 2022-04-13 DIAGNOSIS — R931 Abnormal findings on diagnostic imaging of heart and coronary circulation: Secondary | ICD-10-CM | POA: Diagnosis not present

## 2022-04-13 DIAGNOSIS — K219 Gastro-esophageal reflux disease without esophagitis: Secondary | ICD-10-CM

## 2022-04-13 DIAGNOSIS — E7800 Pure hypercholesterolemia, unspecified: Secondary | ICD-10-CM

## 2022-04-13 DIAGNOSIS — I2510 Atherosclerotic heart disease of native coronary artery without angina pectoris: Secondary | ICD-10-CM

## 2022-04-13 MED ORDER — FAMOTIDINE 20 MG PO TABS: 20.0000 mg | ORAL_TABLET | Freq: Every day | ORAL | Status: AC | PRN

## 2022-04-13 MED ORDER — EZETIMIBE 10 MG PO TABS: 10.0000 mg | ORAL_TABLET | Freq: Every day | ORAL | 3 refills | Status: DC

## 2022-04-13 MED ORDER — METOPROLOL SUCCINATE ER 25 MG PO TB24: 25.0000 mg | ORAL_TABLET | Freq: Every day | ORAL | 3 refills | Status: DC

## 2022-04-13 MED ORDER — NITROGLYCERIN 0.4 MG SL SUBL: 0.4000 mg | SUBLINGUAL_TABLET | SUBLINGUAL | 11 refills | Status: DC | PRN

## 2022-04-13 MED ORDER — ROSUVASTATIN CALCIUM 20 MG PO TABS: 20.0000 mg | ORAL_TABLET | Freq: Every day | ORAL | 3 refills | Status: DC

## 2022-04-13 NOTE — Patient Instructions (Signed)
Medication Instructions:   INCREASE METOPROLOL TO 25 MG ONCE DAILY= 1 WHOLE TABLET ONCE DAILY  NTG 1 TAB UNDER THE TONGUE AS NEEDED FOR CHEST PAIN  INCREASE ROSUVASTATIN TO 20 MG ONCE DAILY= 2 OF THE 10 MG TABLETS ONCE DAILY  START EZETIMIBE 10 MG ONCE DAILY  TAKE PEPCID OTC AS NEEDED FOR INDIGESTION  *If you need a refill on your cardiac medications before your next appointment, please call your pharmacy*   Lab Work:  Your physician recommends that you return for lab work in: 3 MONTHS-FASTING  If you have labs (blood work) drawn today and your tests are completely normal, you will receive your results only by: MyChart Message (if you have MyChart) OR A paper copy in the mail If you have any lab test that is abnormal or we need to change your treatment, we will call you to review the results.   Follow-Up: At [facility], you and your health needs are our priority.  As part of our continuing mission to provide you with exceptional heart care, we have created designated Provider Care Teams.  These Care Teams include your primary Cardiologist (physician) and Advanced Practice Providers (APPs -  Physician Assistants and Nurse Practitioners) who all work together to provide you with the care you need, when you need it.  We recommend signing up for the patient portal called "MyChart".  Sign up information is provided on this After Visit Summary.  MyChart is used to connect with patients for Virtual Visits (Telemedicine).  Patients are able to view lab/test results, encounter notes, upcoming appointments, etc.  Non-urgent messages can be sent to your provider as well.   To learn more about what you can do with MyChart, go to [email or web address].    Your next appointment:   3-4 month(s)  Provider:   Shelva Majestic MD

## 2022-04-13 NOTE — Progress Notes (Signed)
Cardiology Office Note    Date:  04/15/2022   ID:  Meghan, Lozano September 25, 1960, MRN [id number]  PCP:  Donita Brooks, MD  Cardiologist:  Nicki Guadalajara, MD   2 month F/U  cardiology evaluation initially referred through the courtesy of Dr. Lynnea Ferrier   History of Present Illness:  Meghan Lozano is a 62 y.o. female who is followed by Dr. Tanya Nones for primary care. I saw her for my initial evaluation of February 16, 2022.   She has a history of hypothyroidism and has been on levothyroxine replacement since 2009.  She is the daughter of my patients Meghan Lozano and Meghan Lozano. She was recently seen by Dr. Ocie Cornfield at [facility] physicians for endocrinologic evaluation.  She underwent laboratory on January 31, 2022 which showed a total cholesterol 191, HDL 56, LDL 117, triglycerides 107.  In October 2023 TSH was 3.05.  She had normal renal function with creatinine 0.81.  Recently, she has has noticed development of shortness of breath which has progressed and is evident particularly while walking up steps.  She does not have much energy.  Recently, she has had labile blood pressure with blood pressure elevating to 165/110 in October 2023.  She states that that was the first she became aware of blood pressure elevation.  At that time she was started on low-dose valsartan 80 mg and also rosuvastatin 5 mg.  She continues to be on levothyroxine 112 mcg.  She admits to some weight gain.  She denies any presyncope or syncope.  She is unaware of any palpitations.  She denies orthostatic symptoms but at times has noted some vague chest heaviness associated with her shortness of breath and pain in her shoulder.  With her recent symptomatology, she was referred for cardiology evaluation.  During my initial evaluation, I recommended that she undergo a 2D echo Doppler study to assess LV systolic and diastolic function and valvular architecture.  With her significant family history for CAD with her  father undergoing CABG revascularization and PCI and her mother who also underwent PCI, I recommended she undergo coronary CTA for assessment of calcium score and potential intraluminal coronary stenosis.  I recommended she initiate metoprolol succinate 12.5 mg both for more optimal blood pressure control and potential underlying CAD.  I recommended she titrate her very low-dose rosuvastatin to at least 10 mg but suspected further titration will be necessary.  She was to undergo follow-up endocrinologic evaluation on her current levothyroxine dose.  On March 02, 2022 she underwent coronary CTA.  Calcium score was 144, representing 89th percentile.  There was mild left main calcified plaque of 1 to 24% and in the LAD 50 to 69% calcified plaque at the takeoff of the second diagonal vessel.  She had normal ascending thoracic aorta at 3.3 cm.  A 2D echo Doppler study on March 11, 2022 showed normal LV function with EF 60 to 65%, mild MR, and mild aortic sclerosis without stenosis.  Presently, [name] feels well.  She denies chest pain or shortness of breath.  Incidentally noted on her chest CT over read was a moderately large hiatal hernia.  She denies any chest pain or shortness of breath.  She underwent laboratory prior to this office visit.  Lipid studies were improved but LDL was 93 and total cholesterol 157.  LP(a) was elevated at 278.1.  At times she experiences some dyspepsia.  She presents for reevaluation.   Past Medical History:  Diagnosis Date   Anxiety    [employer]rees[employer] 07/18/2020   Arthritis    bilateral hands and neck   Elevated LFTs    Hyperlipidemia    Hypertension 12/2022   Hypothyroidism    Hypothyroidism    Thyroid disease    [employer]reesia 07/18/2020    Past Surgical History:  Procedure Laterality Date   FUNCTIONAL ENDOSCOPIC SINUS SURGERY  1981   GANGLION CYST EXCISION Right 1975   wrist   TONSILLECTOMY AND ADENOIDECTOMY  1966    Current Medications: Outpatient  Medications Prior to Visit  Medication Sig Dispense Refill   ALPRAZolam (XANAX) 0.5 MG tablet TAKE 1 TABLET BY MOUTH 3 TIMES DAILY AS NEEDED. 30 tablet 0   SYNTHROID 112 MCG tablet TAKE 1 TABLET (112 MCG TOTAL) BY MOUTH DAILY. 30 tablet 3   valsartan (DIOVAN) 160 MG tablet Take 80 mg by mouth daily. Take 1/2  tablet by mouth daily     zolpidem (AMBIEN) 10 MG tablet Take 5 mg by mouth at bedtime as needed for sleep.     metoprolol succinate (TOPROL-XL) 25 MG 24 hr tablet Take 0.5 tablets (12.5 mg total) by mouth daily. Take with or immediately following a meal. 45 tablet 3   rosuvastatin (CRESTOR) 10 MG tablet Take 1 tablet (10 mg total) by mouth daily. 90 tablet 1   metoprolol tartrate (LOPRESSOR) 100 MG tablet Take 1 tablet (100 mg total) by mouth once for 1 dose. Take 2 hours prior to procedure. (Patient not taking: Reported on 04/13/2022) 1 tablet 0   No facility-administered medications prior to visit.     Allergies:   Other, Betadine [povidone iodine], Demerol, Fish allergy, Shrimp [shellfish allergy], and Iodine   Social History   Socioeconomic History   Marital status: Married    Spouse name: Not on file   Number of children: Not on file   Years of education: Not on file   Highest education level: Not on file  Occupational History   Not on file  Tobacco Use   Smoking status: Never   Smokeless tobacco: Never  Substance and Sexual Activity   Alcohol use: Yes    Alcohol/week: 3.0 standard drinks of alcohol    Types: 3 Glasses of wine per week    Comment: Social   Drug use: No   Sexual activity: Not Currently    Birth control/protection: Abstinence    Comment: married to [employer],  Other Topics Concern   Not on file  Social History Narrative   Not on file   Social Determinants of Health   Financial Resource Strain: Not on file  Food Insecurity: Not on file  Transportation Needs: Not on file  Physical Activity: Not on file  Stress: Not on file  Social Connections: Not on  file    Social history is notable and that she is married for 42 years.  She has a 66 year old son and a 65 year old daughter who are healthy.  There is no tobacco history.  She drinks occasional Hall.  She stays home caring caring for her 4 grandchildren.  She went to [employer].  Family History:  The patient's family history includes Arthritis in her father and mother; Cerebral aneurysm in her paternal grandmother; Diabetes in her mother; Heart attack in her father; Heart disease in her father, maternal grandfather, mother, paternal grandfather, and paternal uncle; Hyperlipidemia in her father and mother; Hypertension in her father, mother, and sister; Skin cancer in her mother; Stroke in her maternal grandmother.  Both  parents have CAD.  Her father is 50 and mother 44.  She has 2 sisters who have a history of hypertension and hyperlipidemia.  ROS General: Negative; No fevers, chills, or night sweats;  HEENT: Negative; No changes in vision or hearing, sinus congestion, difficulty swallowing Pulmonary: Negative; No cough, wheezing, shortness of breath, hemoptysis Cardiovascular: See HPI GI: Negative; No nausea, vomiting, diarrhea, or abdominal pain GU: Negative; No dysuria, hematuria, or difficulty voiding Musculoskeletal: Negative; no myalgias, joint pain, or weakness Hematologic/Oncology: Negative; no easy bruising, bleeding Endocrine: Negative; no heat/cold intolerance; no diabetes Neuro: Negative; no changes in balance, headaches Skin: Negative; No rashes or skin lesions Psychiatric: Negative; No behavioral problems, depression Sleep: Negative; No snoring, daytime sleepiness, hypersomnolence, bruxism, restless legs, hypnogognic hallucinations, no cataplexy Other comprehensive 14 point system review is negative.   PHYSICAL EXAM:   VS:  BP 130/88   Pulse 73   Ht 5\' 2"  (1.575 m)   Wt 158 lb 3.2 oz (71.8 kg)   SpO2 96%   BMI 28.94 kg/m     Repeat blood pressure by me was 138/84  Wt  Readings from Last 3 Encounters:  04/13/22 158 lb 3.2 oz (71.8 kg)  02/16/22 160 lb 6.4 oz (72.8 kg)  01/03/22 153 lb (69.4 kg)    General: Alert, oriented, no distress.  Skin: normal turgor, no rashes, warm and dry HEENT: Normocephalic, atraumatic. Pupils equal round and reactive to light; sclera anicteric; extraocular muscles intact;  Nose without nasal septal hypertrophy Mouth/Parynx benign; Mallinpatti scale 3 Neck: No JVD, no carotid bruits; normal carotid upstroke Lungs: clear to ausculatation and percussion; no wheezing or rales Chest wall: without tenderness to palpitation Heart: PMI not displaced, RRR, s1 s2 normal, 1/6 systolic murmur, no diastolic murmur, no rubs, gallops, thrills, or heaves Abdomen: soft, nontender; no hepatosplenomehaly, BS+; abdominal aorta nontender and not dilated by palpation. Back: no CVA tenderness Pulses 2+ Musculoskeletal: full range of motion, normal strength, no joint deformities Extremities: no clubbing cyanosis or edema, Homan's sign negative  Neurologic: grossly nonfocal; Cranial nerves grossly wnl Psychologic: Normal mood and affect    Studies/Labs Reviewed:   April 13, 2022 ECG (independently read by me): NSR at 73, no ectopy, normal intervals  February 16, 2022 ECG (independently read by me): NSR at 79, no ectopy  Recent Labs:    Latest Ref Rng & Units 04/11/2022    8:37 AM 02/28/2022    8:52 AM 01/03/2022    9:42 AM  BMP  Glucose 70 - 99 mg/dL 94  102  99   BUN 8 - 27 mg/dL 13  17  19    Creatinine 0.57 - 1.00 mg/dL 0.77  0.80  0.81   BUN/Creat Ratio 12 - 28 17  21   SEE NOTE:   Sodium 134 - 144 mmol/L 143  139  139   Potassium 3.5 - 5.2 mmol/L 4.7  4.7  4.7   Chloride 96 - 106 mmol/L 105  104  102   CO2 20 - 29 mmol/L 25  26  25    Calcium 8.7 - 10.3 mg/dL 9.5  9.5  10.0         Latest Ref Rng & Units 04/11/2022    8:37 AM 01/03/2022    9:42 AM 02/03/2021    8:06 AM  Hepatic Function  Total Protein 6.0 - 8.5 g/dL 6.6   7.2  6.7   Albumin 3.9 - 4.9 g/dL 4.6     AST 0 - 40 IU/L 26  23  24   ALT 0 - 32 IU/L 39  40  32   Alk Phosphatase 44 - 121 IU/L 84     Total Bilirubin 0.0 - 1.2 mg/dL 0.3  0.4  0.4        Latest Ref Rng & Units 04/11/2022    8:37 AM 01/03/2022    9:42 AM 02/03/2021    8:06 AM  CBC  WBC 3.4 - 10.8 x10E3/uL 5.7  7.0  5.0   Hemoglobin 11.1 - 15.9 g/dL 95.213.6  84.115.0  32.414.1   Hematocrit 34.0 - 46.6 % 41.1  44.1  42.9   Platelets 150 - 450 x10E3/uL 283  349  265    Lab Results  Component Value Date   MCV 89 04/11/2022   MCV 88.2 01/03/2022   MCV 88.8 02/03/2021   Lab Results  Component Value Date   TSH 0.121 (L) 04/11/2022   No results found for: "HGBA1C"   BNP No results found for: "BNP"  ProBNP No results found for: "PROBNP"   Lipid Panel     Component Value Date/Time   CHOL 157 04/11/2022 0837   TRIG 70 04/11/2022 0837   HDL 50 04/11/2022 0837   CHOLHDL 3.1 04/11/2022 0837   CHOLHDL 4.0 02/03/2021 0806   VLDL 13 07/08/2016 0841   LDLCALC 93 04/11/2022 0837   LDLCALC 143 (H) 02/03/2021 0806   LABVLDL 14 04/11/2022 0837     RADIOLOGY: No results found.   Additional studies/ records that were reviewed today include:   I reviewed the records of Dr. Tanya NonesPickard and  Deboraha SprangEagle.  CTA: 03/02/2022 FINDINGS: Non-cardiac: [facility]. No significant findings on limited lung and soft tissue windows.   Calcium Score: Calcium noted in LM/LAD   Coronary Arteries: Right dominant with no anomalies   LM: 1-24% calcified plaque   LAD: 50-69% % calcified plaque at take off of D2   D1: Normal   D2: Normal   Circumflex: Normal   OM1: Normal   OM2: Normal   RCA: Normal   PDA: Normal   PLA:  Normal   IMPRESSION: 1. Calcium score 144 which is 89 th percentile for age/sex   2. CAD RADS 3 calcific plaque in proximal / mid LAD Study sent for FFR   3.  Normal ascending thoracic aorta 3.3 cm  ADDENDUM REPORT: 03/02/2022 11:00    ADDENDUM: OVER-READ INTERPRETATION  CT CHEST   The following report is an over-read performed by radiologist Dr. Lovie [facility]olGeoffrey [facility]ile[facility] on 03/02/2022. This over-read does not include interpretation of cardiac or coronary anatomy or pathology. The coronary calcium and coronary CT angiography interpretation by the cardiologist is attached. Imaging of the chest is focused on cardiac structures and excludes much of the chest on CT.   COMPARISON:   None available   FINDINGS:   Cardiovascular: See dedicated report for cardiovascular details.   Mediastinum/Nodes: No adenopathy or acute process in the mediastinum.   Lungs/Pleura: Lungs are clear airways are patent to the extent evaluated.   Upper Abdomen: Incidental imaging of upper abdominal contents with moderately large size hiatal hernia likely mixed type hernia.   Musculoskeletal: No acute or destructive bone process.   CHEST CT overread: IMPRESSION: 1. No acute findings in the chest. 2. Incidental imaging of upper abdominal contents with moderately large size hiatal hernia likely mixed type hernia.    ECHO: 03/11/2022 1. Left ventricular ejection fraction, by estimation, is 60 to 65%. The  left ventricle has normal function. The left ventricle has no regional  wall motion abnormalities. Left ventricular diastolic parameters were  normal. The average left ventricular  global longitudinal strain is -28.3 %. The global longitudinal strain is  normal.   2. Right ventricular systolic function is normal. The right ventricular  size is normal.   3. The mitral valve is normal in structure. Mild mitral valve  regurgitation. No evidence of mitral stenosis.   4. The aortic valve is tricuspid. Aortic valve regurgitation is not  visualized. Aortic valve sclerosis/calcification is present, without any  evidence of aortic stenosis.   5. The inferior vena cava is normal in size with greater than 50%  respiratory  variability, suggesting right atrial pressure of 3 mmHg.    ASSESSMENT:    1. Essential hypertension   2. Pure hypercholesterolemia   3. Coronary artery disease involving native coronary artery of native heart without angina pectoris   4. Agatston coronary artery calcium score between 100 and 199   5. Elevated Lp(a)   6. Hiatal hernia   7. Gastroesophageal reflux disease without esophagitis     PLAN:  Ms. Tinlee Navarrette is a very pleasant 62 year old female who is followed by Dr. Dennard Schaumann for primary care and sees Dr. Gearldine Shown of GYN.  She has a history of hypothyroidism and has been on thyroid replacement since 2009.  In October 2023 she became aware of some blood pressure lability.  She was started on valsartan and also was started on rosuvastatin 5 mg.  Subsequent lab work on January 31, 2022 showed total cholesterol 191 HDL 56 triglycerides 107 LDL 117 and non-HDL 136.  She has experienced shortness of breath with activity and at times has also experienced some vague chest tightness and heaviness.  When I saw her for my initial evaluation on February 16, 2022, blood pressure was elevated.  At that time I initiated low-dose metoprolol succinate at 12.5 mg.  She has subsequently undergone echo Doppler study which confirmed normal LV systolic and diastolic function with mild MR, mild aortic valve sclerosis without stenosis.  Coronary CTA demonstrated a calcium score of 144 representing 89th percentile mild plaque in the left main and moderate in the LAD in the region of the second diagonal takeoff.  FFR analysis was not significant.  At her initial visit I at least recommended titration of rosuvastatin to 10 mg.  Subsequent laboratory has shown some improvement but LDL cholesterol is 93.  I also check LP(a) which is significantly elevated at 278.1.  I had a long discussion with her today particularly with reference to her significant LP(a) elevation with increased potential for vulnerable plaque.   Both parents have established CAD.  Presently, I am increasing rosuvastatin to 20 mg but also adding Zetia 10 mg with a target LDL of less than 50.  I am further titrating metoprolol succinate to 25 mg.  She was found incidentally to have a moderately large hiatal hernia.  I have suggested over-the-counter Pepcid if she has intermittent dyspepsia.  She will be undergoing endocrinologic follow-up since her most recent TSH was over suppressed at 0.121 and she may require dose reduction of her levothyroxine.  In 3 months I am recommending repeat comprehensive metabolic panel and lipid panel and we will see her in 3 to 4 months for follow-up evaluation.    Medication Adjustments/Labs and Tests Ordered: Current medicines are reviewed at length with the patient today.  Concerns regarding medicines are outlined above.  Medication changes, Labs  and Tests ordered today are listed in the Patient Instructions below. Patient Instructions  Medication Instructions:   INCREASE METOPROLOL TO 25 MG ONCE DAILY= 1 WHOLE TABLET ONCE DAILY  NTG 1 TAB UNDER THE TONGUE AS NEEDED FOR CHEST PAIN  INCREASE ROSUVASTATIN TO 20 MG ONCE DAILY= 2 OF THE 10 MG TABLETS ONCE DAILY  START EZETIMIBE 10 MG ONCE DAILY  TAKE PEPCID OTC AS NEEDED FOR INDIGESTION  *If you need a refill on your cardiac medications before your next appointment, please call your pharmacy*   Lab Work:  Your physician recommends that you return for lab work in: 3 MONTHS-FASTING  If you have labs (blood work) drawn today and your tests are completely normal, you will receive your results only by: MyChart Message (if you have MyChart) OR A paper copy in the mail If you have any lab test that is abnormal or we need to change your treatment, we will call you to review the results.   Follow-Up: At [facility], you and your health needs are our priority.  As part of our continuing mission to provide you with exceptional heart care, we have  created designated Provider Care Teams.  These Care Teams include your primary Cardiologist (physician) and Advanced Practice Providers (APPs -  Physician Assistants and Nurse Practitioners) who all work together to provide you with the care you need, when you need it.  We recommend signing up for the patient portal called "MyChart".  Sign up information is provided on this After Visit Summary.  MyChart is used to connect with patients for Virtual Visits (Telemedicine).  Patients are able to view lab/test results, encounter notes, upcoming appointments, etc.  Non-urgent messages can be sent to your provider as well.   To learn more about what you can do with MyChart, go to [email or web address].    Your next appointment:   3-4 month(s)  Provider:   Nicki Guadalajara MD      Signed, Nicki Guadalajara, MD  04/15/2022 11:50 AM    [facility] [address], [place], [place]  [address] Phone: [phone]

## 2022-04-15 ENCOUNTER — Encounter: Payer: Self-pay | Admitting: Cardiovascular Disease

## 2022-05-04 DIAGNOSIS — E039 Hypothyroidism, unspecified: Secondary | ICD-10-CM | POA: Diagnosis not present

## 2022-05-04 DIAGNOSIS — R7303 Prediabetes: Secondary | ICD-10-CM | POA: Diagnosis not present

## 2022-05-04 DIAGNOSIS — I10 Essential (primary) hypertension: Secondary | ICD-10-CM | POA: Diagnosis not present

## 2022-06-18 DIAGNOSIS — H66002 Acute suppurative otitis media without spontaneous rupture of ear drum, left ear: Secondary | ICD-10-CM | POA: Diagnosis not present

## 2022-06-22 ENCOUNTER — Ambulatory Visit: Payer: 59 | Admitting: Family Medicine

## 2022-06-22 VITALS — BP 124/82 | HR 80 | Temp 98.7°F | Ht 62.0 in | Wt 157.0 lb

## 2022-06-22 DIAGNOSIS — H66002 Acute suppurative otitis media without spontaneous rupture of ear drum, left ear: Secondary | ICD-10-CM

## 2022-06-22 MED ORDER — AMOXICILLIN-POT CLAVULANATE 875-125 MG PO TABS: 1.0000 | ORAL_TABLET | Freq: Two times a day (BID) | ORAL | 0 refills | Status: DC

## 2022-06-22 NOTE — Assessment & Plan Note (Signed)
Patient continues to experience left ear pain despite Cefdinir for 5 days. TM remains injected and bulging. Will switch to Augmentin 875-125mg  BID for 10 days. Instructed to return to office if symptoms worsen or are not improved in 3 days. May continue Tylenol and Ibuprofen PRN.

## 2022-06-22 NOTE — Progress Notes (Signed)
Acute Office Visit  Subjective:     Patient ID: Meghan Lozano, female    DOB: 1960/12/28, 62 y.o.   MRN: [id number]  Chief Complaint  Patient presents with   Ear Pain    Left ear pain, antibiotic x 5 days, Patient states noises are muffled, jaw pain, and fluid on the ear.    HPI Patient is in today for ongoing left ear pain. She did have a viral illness prior. Also having dizziness and nausea, neck pain, jaw pain. Was seen at [facility] and treated with Cefdinir, she is on day 5 without relief. Her symptoms are unchanged with antibiotics. Has tried Ibuprofen and tylenol.  Review of Systems  HENT:  Positive for ear discharge, ear pain and hearing loss.   All other systems reviewed and are negative.   Past Medical History:  Diagnosis Date   Anxiety    [employer] 07/18/2020   Arthritis    bilateral hands and neck   Elevated LFTs    Hyperlipidemia    Hypertension 12/2022   Hypothyroidism    Hypothyroidism    Thyroid disease    [employer] 07/18/2020   Past Surgical History:  Procedure Laterality Date   FUNCTIONAL ENDOSCOPIC SINUS SURGERY  1981   GANGLION CYST EXCISION Right 1975   wrist   TONSILLECTOMY AND ADENOIDECTOMY  1966   Current Outpatient Medications on File Prior to Visit  Medication Sig Dispense Refill   ALPRAZolam (XANAX) 0.5 MG tablet TAKE 1 TABLET BY MOUTH 3 TIMES DAILY AS NEEDED. 30 tablet 0   ezetimibe (ZETIA) 10 MG tablet Take 1 tablet (10 mg total) by mouth daily. 90 tablet 3   famotidine (PEPCID) 20 MG tablet Take 1 tablet (20 mg total) by mouth daily as needed for heartburn or indigestion.     metoprolol succinate (TOPROL-XL) 25 MG 24 hr tablet Take 1 tablet (25 mg total) by mouth daily. Take with or immediately following a meal. 90 tablet 3   nitroGLYCERIN (NITROSTAT) 0.4 MG SL tablet Place 1 tablet (0.4 mg total) under the tongue every 5 (five) minutes as needed for chest pain. 25 tablet 11   rosuvastatin (CRESTOR) 20 MG tablet Take 1 tablet  (20 mg total) by mouth daily. 90 tablet 3   SYNTHROID 112 MCG tablet TAKE 1 TABLET (112 MCG TOTAL) BY MOUTH DAILY. 30 tablet 3   valsartan (DIOVAN) 160 MG tablet Take 80 mg by mouth daily. Take 1/2  tablet by mouth daily     zolpidem (AMBIEN) 10 MG tablet Take 5 mg by mouth at bedtime as needed for sleep.     No current facility-administered medications on file prior to visit.   Allergies  Allergen Reactions   Other    Betadine [Povidone Iodine] Swelling   Demerol Nausea And Vomiting   Fish Allergy Nausea And Vomiting   Shrimp [Shellfish Allergy] Nausea And Vomiting   Iodine Itching and Rash       Objective:    BP 124/82   Pulse 80   Temp 98.7 F (37.1 C) (Oral)   Ht 5\' 2"  (1.575 m)   Wt 157 lb (71.2 kg)   SpO2 96%   BMI 28.72 kg/m    Physical Exam Vitals and nursing note reviewed.  Constitutional:      Appearance: Normal appearance. She is normal weight.  HENT:     Head: Normocephalic and atraumatic.     Right Ear: Tympanic membrane, ear canal and external ear normal.  Left Ear: Tympanic membrane is erythematous and bulging.     Nose: Nose normal.     Mouth/Throat:     Mouth: Mucous membranes are moist.     Pharynx: Oropharynx is clear.  Eyes:     Conjunctiva/sclera: Conjunctivae normal.  Musculoskeletal:     Cervical back: Tenderness present.  Lymphadenopathy:     Cervical: No cervical adenopathy.  Skin:    General: Skin is warm and dry.  Neurological:     General: No focal deficit present.     Mental Status: She is alert and oriented to person, place, and time. Mental status is at baseline.  Psychiatric:        Mood and Affect: Mood normal.        Behavior: Behavior normal.        Thought Content: Thought content normal.        Judgment: Judgment normal.     No results found for any visits on 06/22/22.      Assessment & Plan:   Problem List Items Addressed This Visit       Nervous and Auditory   Non-recurrent acute suppurative otitis media  of left ear without spontaneous rupture of tympanic membrane - Primary    Patient continues to experience left ear pain despite Cefdinir for 5 days. TM remains injected and bulging. Will switch to Augmentin 875-125mg  BID for 10 days. Instructed to return to office if symptoms worsen or are not improved in 3 days. May continue Tylenol and Ibuprofen PRN.      Relevant Medications   amoxicillin-clavulanate (AUGMENTIN) 875-125 MG tablet    Meds ordered this encounter  Medications   amoxicillin-clavulanate (AUGMENTIN) 875-125 MG tablet    Sig: Take 1 tablet by mouth 2 (two) times daily.    Dispense:  20 tablet    Refill:  0    Order Specific Question:   Supervising Provider    Answer:   Jenna Luo T [id number]    Return if symptoms worsen or fail to improve.  Rubie Maid, FNP

## 2022-06-27 ENCOUNTER — Encounter: Payer: Self-pay | Admitting: Family Medicine

## 2022-06-27 ENCOUNTER — Telehealth: Payer: Self-pay

## 2022-06-27 NOTE — Telephone Encounter (Signed)
Pt called in asking to get a referral to an ENT. Pt states that she can no longer hear out of her ear. Pt states that she sent a message via MyChart. Please advise.   Cb#: [phone]

## 2022-06-28 ENCOUNTER — Other Ambulatory Visit: Payer: Self-pay

## 2022-06-28 DIAGNOSIS — H9209 Otalgia, unspecified ear: Secondary | ICD-10-CM

## 2022-07-01 DIAGNOSIS — Z1231 Encounter for screening mammogram for malignant neoplasm of breast: Secondary | ICD-10-CM | POA: Diagnosis not present

## 2022-07-01 LAB — HM MAMMOGRAPHY

## 2022-07-05 DIAGNOSIS — H9042 Sensorineural hearing loss, unilateral, left ear, with unrestricted hearing on the contralateral side: Secondary | ICD-10-CM | POA: Diagnosis not present

## 2022-07-05 DIAGNOSIS — H903 Sensorineural hearing loss, bilateral: Secondary | ICD-10-CM | POA: Diagnosis not present

## 2022-07-08 ENCOUNTER — Encounter: Payer: Self-pay | Admitting: Family Medicine

## 2022-07-20 DIAGNOSIS — I10 Essential (primary) hypertension: Secondary | ICD-10-CM | POA: Diagnosis not present

## 2022-07-20 DIAGNOSIS — E7800 Pure hypercholesterolemia, unspecified: Secondary | ICD-10-CM | POA: Diagnosis not present

## 2022-07-20 LAB — COMPREHENSIVE METABOLIC PANEL
ALT: 123 IU/L — ABNORMAL HIGH (ref 0–32)
AST: 39 IU/L (ref 0–40)
Albumin/Globulin Ratio: 2.2 (ref 1.2–2.2)
Albumin: 4.2 g/dL (ref 3.9–4.9)
Alkaline Phosphatase: 96 IU/L (ref 44–121)
BUN/Creatinine Ratio: 24 (ref 12–28)
BUN: 20 mg/dL (ref 8–27)
Bilirubin Total: 0.5 mg/dL (ref 0.0–1.2)
CO2: 27 mmol/L (ref 20–29)
Calcium: 9.2 mg/dL (ref 8.7–10.3)
Chloride: 102 mmol/L (ref 96–106)
Creatinine, Ser: 0.83 mg/dL (ref 0.57–1.00)
Globulin, Total: 1.9 g/dL (ref 1.5–4.5)
Glucose: 87 mg/dL (ref 70–99)
Potassium: 5.1 mmol/L (ref 3.5–5.2)
Sodium: 140 mmol/L (ref 134–144)
Total Protein: 6.1 g/dL (ref 6.0–8.5)
eGFR: 79 mL/min/{1.73_m2} (ref >59)

## 2022-07-20 LAB — LIPID PANEL
Chol/HDL Ratio: 2.5 ratio (ref 0.0–4.4)
Cholesterol, Total: 143 mg/dL (ref 100–199)
HDL: 57 mg/dL (ref >39)
LDL Chol Calc (NIH): 70 mg/dL (ref 0–99)
Triglycerides: 84 mg/dL (ref 0–149)
VLDL Cholesterol Cal: 16 mg/dL (ref 5–40)

## 2022-07-25 ENCOUNTER — Ambulatory Visit: Payer: 59 | Attending: Cardiovascular Disease | Admitting: Cardiovascular Disease

## 2022-07-25 ENCOUNTER — Encounter: Payer: Self-pay | Admitting: Cardiovascular Disease

## 2022-07-25 VITALS — BP 128/78 | HR 75 | Ht 62.0 in | Wt 159.2 lb

## 2022-07-25 DIAGNOSIS — R748 Abnormal levels of other serum enzymes: Secondary | ICD-10-CM | POA: Diagnosis not present

## 2022-07-25 DIAGNOSIS — E7841 Elevated Lipoprotein(a): Secondary | ICD-10-CM | POA: Diagnosis not present

## 2022-07-25 DIAGNOSIS — K449 Diaphragmatic hernia without obstruction or gangrene: Secondary | ICD-10-CM

## 2022-07-25 DIAGNOSIS — E7800 Pure hypercholesterolemia, unspecified: Secondary | ICD-10-CM

## 2022-07-25 DIAGNOSIS — E039 Hypothyroidism, unspecified: Secondary | ICD-10-CM | POA: Diagnosis not present

## 2022-07-25 DIAGNOSIS — R931 Abnormal findings on diagnostic imaging of heart and coronary circulation: Secondary | ICD-10-CM

## 2022-07-25 DIAGNOSIS — Z8249 Family history of ischemic heart disease and other diseases of the circulatory system: Secondary | ICD-10-CM | POA: Diagnosis not present

## 2022-07-25 NOTE — Progress Notes (Signed)
Cardiology Office Note    Date:  07/27/2022   ID:  CHEMAINE Lozano, DOB 02-May-1960, MRN [id number]  PCP:  Donita Brooks, MD  Cardiologist:  Nicki Guadalajara, MD   4 month F/U  cardiology evaluation initially referred through the courtesy of Dr. Lynnea Lozano   History of Present Illness:  Meghan Lozano is a 62 y.o. female who is followed by Dr. Tanya Nones for primary care. I saw her for my initial evaluation of February 16, 2022.  I last saw her on April 13, 2022.  She presents for a 26-month follow-up evaluation.   [name]  has a history of hypothyroidism and has been on levothyroxine replacement since 2009.  She is the daughter of my patients Meghan Lozano and Meghan Lozano. She was recently seen by Dr. Ocie Lozano at [facility] physicians for endocrinologic evaluation.  She underwent laboratory on January 31, 2022 which showed a total cholesterol 191, HDL 56, LDL 117, triglycerides 107.  In October 2023 TSH was 3.05.  She had normal renal function with creatinine 0.81.  Recently, she has has noticed development of shortness of breath which has progressed and is evident particularly while walking up steps.  She does not have much energy.  Recently, she has had labile blood pressure with blood pressure elevating to 165/110 in October 2023.  She states that that was the first she became aware of blood pressure elevation.  At that time she was started on low-dose valsartan 80 mg and also rosuvastatin 5 mg.  She continues to be on levothyroxine 112 mcg.  She admits to some weight gain.  She denies any presyncope or syncope.  She is unaware of any palpitations.  She denies orthostatic symptoms but at times has noted some vague chest heaviness associated with her shortness of breath and pain in her shoulder.  With her recent symptomatology, she was referred for cardiology evaluation.  During my initial evaluation, I recommended that she undergo a 2D echo Doppler study to assess LV systolic and  diastolic function and valvular architecture.  With her significant family history for CAD with her father undergoing CABG revascularization and PCI and her mother who also underwent PCI, I recommended she undergo coronary CTA for assessment of calcium score and potential intraluminal coronary stenosis.  I recommended she initiate metoprolol succinate 12.5 mg both for more optimal blood pressure control and potential underlying CAD.  I recommended she titrate her very low-dose rosuvastatin to at least 10 mg but suspected further titration will be necessary.  She was to undergo follow-up endocrinologic evaluation on her current levothyroxine dose.  On March 02, 2022 she underwent coronary CTA.  Calcium score was 144, representing 89th percentile.  There was mild left main calcified plaque of 1 to 24% and in the LAD 50 to 69% calcified plaque at the takeoff of the second diagonal vessel.  She had normal ascending thoracic aorta at 3.3 cm.  A 2D echo Doppler study on March 11, 2022 showed normal LV function with EF 60 to 65%, mild MR, and mild aortic sclerosis without stenosis.  I saw her for initial follow-up on April 13, 2022 at which time she felt well.  Incidentally noted on her chest CT over read was a moderately large hiatal hernia.  She denies any chest pain or shortness of breath.  She underwent laboratory prior to this office visit.  Lipid studies were improved but LDL was 93 and total cholesterol 157.  LP(a)  was elevated at 278.1.  At times she experiences some dyspepsia.  At evaluation, parents having established coronary artery disease significant elevation of LP(a) potentially contributing to vulnerable plaque, I recommended further titration of rosuvastatin to 20 mg and added Zetia 10 mg with target LDL ideally less than 50.  I also further titrated metoprolol succinate to 25 mg.  As I last saw her, Ms. Meghan Lozano has felt well.  She underwent follow-up laboratory on Jul 20, 2022.  Lipid studies  had improved and total cholesterol was 143, triglycerides 84, HDL 57, and LDL 70.  However, she states recently she had significant ear congestion and infection and was treated with antibiotics.  Most recent chemistry now reveals significant increase in ALT from 39 up to 123 with normal AST at 39.  She presents for evaluation.   Past Medical History:  Diagnosis Date   Anxiety    [employer]rees[employer] 07/18/2020   Arthritis    bilateral hands and neck   Elevated LFTs    Hyperlipidemia    Hypertension 12/2022   Hypothyroidism    Hypothyroidism    Thyroid disease    [employer]reesia 07/18/2020    Past Surgical History:  Procedure Laterality Date   FUNCTIONAL ENDOSCOPIC SINUS SURGERY  1981   GANGLION CYST EXCISION Right 1975   wrist   TONSILLECTOMY AND ADENOIDECTOMY  1966    Current Medications: Outpatient Medications Prior to Visit  Medication Sig Dispense Refill   ALPRAZolam (XANAX) 0.5 MG tablet TAKE 1 TABLET BY MOUTH 3 TIMES DAILY AS NEEDED. 30 tablet 0   ezetimibe (ZETIA) 10 MG tablet Take 1 tablet (10 mg total) by mouth daily. 90 tablet 3   famotidine (PEPCID) 20 MG tablet Take 1 tablet (20 mg total) by mouth daily as needed for heartburn or indigestion.     metoprolol succinate (TOPROL-XL) 25 MG 24 hr tablet Take 1 tablet (25 mg total) by mouth daily. Take with or immediately following a meal. 90 tablet 3   rosuvastatin (CRESTOR) 20 MG tablet Take 1 tablet (20 mg total) by mouth daily. 90 tablet 3   SYNTHROID 112 MCG tablet TAKE 1 TABLET (112 MCG TOTAL) BY MOUTH DAILY. 30 tablet 3   valsartan (DIOVAN) 160 MG tablet Take 80 mg by mouth daily. Take 1/2  tablet by mouth daily     zolpidem (AMBIEN) 10 MG tablet Take 5 mg by mouth at bedtime as needed for sleep.     amoxicillin-clavulanate (AUGMENTIN) 875-125 MG tablet Take 1 tablet by mouth 2 (two) times daily. (Patient not taking: Reported on 07/25/2022) 20 tablet 0   nitroGLYCERIN (NITROSTAT) 0.4 MG SL tablet Place 1 tablet (0.4 mg total) under the  tongue every 5 (five) minutes as needed for chest pain. 25 tablet 11   No facility-administered medications prior to visit.     Allergies:   Other, Betadine [povidone iodine], Demerol, Fish allergy, Shrimp [shellfish allergy], and Iodine   Social History   Socioeconomic History   Marital status: Married    Spouse name: Not on file   Number of children: Not on file   Years of education: Not on file   Highest education level: Not on file  Occupational History   Not on file  Tobacco Use   Smoking status: Never   Smokeless tobacco: Never  Substance and Sexual Activity   Alcohol use: Yes    Alcohol/week: 3.0 standard drinks of alcohol    Types: 3 Glasses of wine per week    Comment: Social  Drug use: No   Sexual activity: Not Currently    Birth control/protection: Abstinence    Comment: married to [employer],  Other Topics Concern   Not on file  Social History Narrative   Not on file   Social Determinants of Health   Financial Resource Strain: Not on file  Food Insecurity: Not on file  Transportation Needs: Not on file  Physical Activity: Not on file  Stress: Not on file  Social Connections: Not on file    Social history is notable and that she is married for 42 years.  She has a 48 year old son and a 16 year old daughter who are healthy.  There is no tobacco history.  She drinks occasional Hall.  She stays home caring caring for her 4 grandchildren.  She went to [employer].  Family History:  The patient's family history includes Arthritis in her father and mother; Cerebral aneurysm in her paternal grandmother; Diabetes in her mother; Heart attack in her father; Heart disease in her father, maternal grandfather, mother, paternal grandfather, and paternal uncle; Hyperlipidemia in her father and mother; Hypertension in her father, mother, and sister; Skin cancer in her mother; Stroke in her maternal grandmother.  Both parents have CAD.  Her father is 69 and mother 74.  She has 2 sisters  who have a history of hypertension and hyperlipidemia.  ROS General: Negative; No fevers, chills, or night sweats;  HEENT: Negative; No changes in vision or hearing, sinus congestion, difficulty swallowing Pulmonary: Negative; No cough, wheezing, shortness of breath, hemoptysis Cardiovascular: See HPI GI: Negative; No nausea, vomiting, diarrhea, or abdominal pain GU: Negative; No dysuria, hematuria, or difficulty voiding Musculoskeletal: Negative; no myalgias, joint pain, or weakness Hematologic/Oncology: Negative; no easy bruising, bleeding Endocrine: Negative; no heat/cold intolerance; no diabetes Neuro: Negative; no changes in balance, headaches Skin: Negative; No rashes or skin lesions Psychiatric: Negative; No behavioral problems, depression Sleep: Negative; No snoring, daytime sleepiness, hypersomnolence, bruxism, restless legs, hypnogognic hallucinations, no cataplexy Other comprehensive 14 point system review is negative.   PHYSICAL EXAM:   VS:  BP 128/78   Pulse 75   Ht 5\' 2"  (1.575 m)   Wt 159 lb 3.2 oz (72.2 kg)   SpO2 95%   BMI 29.12 kg/m     Repeat blood pressure by me was 128/72  Wt Readings from Last 3 Encounters:  07/25/22 159 lb 3.2 oz (72.2 kg)  06/22/22 157 lb (71.2 kg)  04/13/22 158 lb 3.2 oz (71.8 kg)    General: Alert, oriented, no distress.  Skin: normal turgor, no rashes, warm and dry HEENT: Normocephalic, atraumatic. Pupils equal round and reactive to light; sclera anicteric; extraocular muscles intact; Nose without nasal septal hypertrophy Mouth/Parynx benign; Mallinpatti scale 3 Neck: No JVD, no carotid bruits; normal carotid upstroke Lungs: clear to ausculatation and percussion; no wheezing or rales Chest wall: without tenderness to palpitation Heart: PMI not displaced, RRR, s1 s2 normal, 1/6 systolic murmur, no diastolic murmur, no rubs, gallops, thrills, or heaves Abdomen: soft, nontender; no hepatosplenomehaly, BS+; abdominal aorta nontender  and not dilated by palpation. Back: no CVA tenderness Pulses 2+ Musculoskeletal: full range of motion, normal strength, no joint deformities Extremities: no clubbing cyanosis or edema, Homan's sign negative  Neurologic: grossly nonfocal; Cranial nerves grossly wnl Psychologic: Normal mood and affect   Studies/Labs Reviewed:   Jul 25, 2022 ECG (independently read by me): NSR at 75, normal intervals  April 13, 2022 ECG (independently read by me): NSR at 73, no ectopy, normal intervals  February 16, 2022 ECG (independently read by me): NSR at 79, no ectopy  Recent Labs:    Latest Ref Rng & Units 07/20/2022    8:12 AM 04/11/2022    8:37 AM 02/28/2022    8:52 AM  BMP  Glucose 70 - 99 mg/dL 87  94  161   BUN 8 - 27 mg/dL 20  13  17    Creatinine 0.57 - 1.00 mg/dL 0.96  0.45  4.09   BUN/Creat Ratio 12 - 28 24  17  21    Sodium 134 - 144 mmol/L 140  143  139   Potassium 3.5 - 5.2 mmol/L 5.1  4.7  4.7   Chloride 96 - 106 mmol/L 102  105  104   CO2 20 - 29 mmol/L 27  25  26    Calcium 8.7 - 10.3 mg/dL 9.2  9.5  9.5         Latest Ref Rng & Units 07/20/2022    8:12 AM 04/11/2022    8:37 AM 01/03/2022    9:42 AM  Hepatic Function  Total Protein 6.0 - 8.5 g/dL 6.1  6.6  7.2   Albumin 3.9 - 4.9 g/dL 4.2  4.6    AST 0 - 40 IU/L 39  26  23   ALT 0 - 32 IU/L 123  39  40   Alk Phosphatase 44 - 121 IU/L 96  84    Total Bilirubin 0.0 - 1.2 mg/dL 0.5  0.3  0.4        Latest Ref Rng & Units 04/11/2022    8:37 AM 01/03/2022    9:42 AM 02/03/2021    8:06 AM  CBC  WBC 3.4 - 10.8 x10E3/uL 5.7  7.0  5.0   Hemoglobin 11.1 - 15.9 g/dL 81.1  91.4  78.2   Hematocrit 34.0 - 46.6 % 41.1  44.1  42.9   Platelets 150 - 450 x10E3/uL 283  349  265    Lab Results  Component Value Date   MCV 89 04/11/2022   MCV 88.2 01/03/2022   MCV 88.8 02/03/2021   Lab Results  Component Value Date   TSH 0.121 (L) 04/11/2022   No results found for: "HGBA1C"   BNP No results found for: "BNP"  ProBNP No  results found for: "PROBNP"   Lipid Panel     Component Value Date/Time   CHOL 143 07/20/2022 0812   TRIG 84 07/20/2022 0812   HDL 57 07/20/2022 0812   CHOLHDL 2.5 07/20/2022 0812   CHOLHDL 4.0 02/03/2021 0806   VLDL 13 07/08/2016 0841   LDLCALC 70 07/20/2022 0812   LDLCALC 143 (H) 02/03/2021 0806   LABVLDL 16 07/20/2022 0812     RADIOLOGY: No results found.   Additional studies/ records that were reviewed today include:   I reviewed the records of Dr. Tanya Nones and  Deboraha Sprang.  CTA: 03/02/2022 FINDINGS: Non-cardiac: See separate report from [facility]. No significant findings on limited lung and soft tissue windows.   Calcium Score: Calcium noted in LM/LAD   Coronary Arteries: Right dominant with no anomalies   LM: 1-24% calcified plaque   LAD: 50-69% % calcified plaque at take off of D2   D1: Normal   D2: Normal   Circumflex: Normal   OM1: Normal   OM2: Normal   RCA: Normal   PDA: Normal   PLA:  Normal   IMPRESSION: 1. Calcium score 144 which is 89 th percentile for age/sex   2. CAD  RADS 3 calcific plaque in proximal / mid LAD Study sent for FFR   3.  Normal ascending thoracic aorta 3.3 cm  ADDENDUM REPORT: 03/02/2022 11:00   ADDENDUM: OVER-READ INTERPRETATION  CT CHEST   The following report is an over-read performed by radiologist Dr. Lovie Chol [facility] on 03/02/2022. This over-read does not include interpretation of cardiac or coronary anatomy or pathology. The coronary calcium and coronary CT angiography interpretation by the cardiologist is attached. Imaging of the chest is focused on cardiac structures and excludes much of the chest on CT.   COMPARISON:   None available   FINDINGS:   Cardiovascular: See dedicated report for cardiovascular details.   Mediastinum/Nodes: No adenopathy or acute process in the mediastinum.   Lungs/Pleura: Lungs are clear airways are patent to the extent evaluated.    Upper Abdomen: Incidental imaging of upper abdominal contents with moderately large size hiatal hernia likely mixed type hernia.   Musculoskeletal: No acute or destructive bone process.   CHEST CT overread: IMPRESSION: 1. No acute findings in the chest. 2. Incidental imaging of upper abdominal contents with moderately large size hiatal hernia likely mixed type hernia.    ECHO: 03/11/2022 1. Left ventricular ejection fraction, by estimation, is 60 to 65%. The  left ventricle has normal function. The left ventricle has no regional  wall motion abnormalities. Left ventricular diastolic parameters were  normal. The average left ventricular  global longitudinal strain is -28.3 %. The global longitudinal strain is  normal.   2. Right ventricular systolic function is normal. The right ventricular  size is normal.   3. The mitral valve is normal in structure. Mild mitral valve  regurgitation. No evidence of mitral stenosis.   4. The aortic valve is tricuspid. Aortic valve regurgitation is not  visualized. Aortic valve sclerosis/calcification is present, without any  evidence of aortic stenosis.   5. The inferior vena cava is normal in size with greater than 50%  respiratory variability, suggesting right atrial pressure of 3 mmHg.    ASSESSMENT:    1. Agatston coronary artery calcium score between 100 and 199   2. Elevated Lp(a)   3. Pure hypercholesterolemia   4. Elevated liver enzymes   5. Hiatal hernia   6. Hypothyroidism, unspecified type   7. Family history of heart disease     PLAN:  Ms. Jaylyne Grab is a very pleasant 62 year old female who is followed by Dr. Tanya Nones for primary care and sees Dr. Garrison Columbus of GYN.  She has a history of hypothyroidism and has been on thyroid replacement since 2009.  In October 2023 she became aware of some blood pressure lability.  She was started on valsartan and also was started on rosuvastatin 5 mg.  Subsequent lab work on January 31, 2022 showed total cholesterol 191 HDL 56 triglycerides 107 LDL 117 and non-HDL 136.  She has experienced shortness of breath with activity and at times has also experienced some vague chest tightness and heaviness.  When I saw her for my initial evaluation on February 16, 2022, blood pressure was elevated.  At that time I initiated low-dose metoprolol succinate at 12.5 mg. A echo Doppler study  confirmed normal LV systolic and diastolic function with mild MR, mild aortic valve sclerosis without stenosis.  Coronary CTA demonstrated a calcium score of 144 representing 89th percentile mild plaque in the left main and moderate in the LAD in the region of the second diagonal takeoff.  FFR analysis was not significant.  At her initial visit I at least recommended titration of rosuvastatin to 10 mg.  Subsequent laboratory has shown some improvement but LDL cholesterol was 93 and  LP(a)  was significantly elevated at 278.1.  Result, I added Zetia 10 mg to an increase rosuvastatin to 20 mg with plan for target LDL less than 50.  I further titrated metoprolol to 25 mg.  Subsequent laboratory on Jul 20, 2022 does show improvement in lipid studies with LDL cholesterol at 70.  However, ALT is now significantly increased at 123 which may or may not be due to the increase rosuvastatin and possibly due to recent infection and antibiotic treatment.  However, presently I have recommended she discontinue rosuvastatin.  Particularly with her markedly elevated LP(a) and significant family history of CAD I we will have her have a repeat comprehensive metabolic panel in 2 weeks.  I also have discussed with the Juliene Pina, our pharmacist concerning initiation of PCSK9 inhibition with Repatha which should potentially also reduce LP(a) by approximately 26 to 30%.  I will arrange a follow-up appointment for her to see our pharmacist in 1 month for initiation of Repatha.  Will see her in 4 to 5 months for follow-up evaluation.   Medication  Adjustments/Labs and Tests Ordered: Current medicines are reviewed at length with the patient today.  Concerns regarding medicines are outlined above.  Medication changes, Labs and Tests ordered today are listed in the Patient Instructions below. Patient Instructions  Medication Instructions:  STOP rosuvastatin CONTINUE all other current medications   *If you need a refill on your cardiac medications before your next appointment, please call your pharmacy*   Lab Work: Non-Fasting CMET in 2 weeks  If you have labs (blood work) drawn today and your tests are completely normal, you will receive your results only by: MyChart Message (if you have MyChart) OR A paper copy in the mail If you have any lab test that is abnormal or we need to change your treatment, we will call you to review the results.   Follow-Up: At [facility], you and your health needs are our priority.  As part of our continuing mission to provide you with exceptional heart care, we have created designated Provider Care Teams.  These Care Teams include your primary Cardiologist (physician) and Advanced Practice Providers (APPs -  Physician Assistants and Nurse Practitioners) who all work together to provide you with the care you need, when you need it.  We recommend signing up for the patient portal called "MyChart".  Sign up information is provided on this After Visit Summary.  MyChart is used to connect with patients for Virtual Visits (Telemedicine).  Patients are able to view lab/test results, encounter notes, upcoming appointments, etc.  Non-urgent messages can be sent to your provider as well.   To learn more about what you can do with MyChart, go to [email or web address].    Your next appointment:    4-5 months with Dr. Tresa Endo Other Instructions  LIPID CLINIC consult with clinical pharmacist to discuss Repatha OR Praluent in about 3-4 weeks     Signed, Nicki Guadalajara, MD  07/27/2022 5:36 PM    [facility] [address], [place], [place]  [address] Phone: [phone]

## 2022-07-25 NOTE — Patient Instructions (Addendum)
Medication Instructions:  STOP rosuvastatin CONTINUE all other current medications   *If you need a refill on your cardiac medications before your next appointment, please call your pharmacy*   Lab Work: Non-Fasting CMET in 2 weeks  If you have labs (blood work) drawn today and your tests are completely normal, you will receive your results only by: MyChart Message (if you have MyChart) OR A paper copy in the mail If you have any lab test that is abnormal or we need to change your treatment, we will call you to review the results.   Follow-Up: At [facility], you and your health needs are our priority.  As part of our continuing mission to provide you with exceptional heart care, we have created designated Provider Care Teams.  These Care Teams include your primary Cardiologist (physician) and Advanced Practice Providers (APPs -  Physician Assistants and Nurse Practitioners) who all work together to provide you with the care you need, when you need it.  We recommend signing up for the patient portal called "MyChart".  Sign up information is provided on this After Visit Summary.  MyChart is used to connect with patients for Virtual Visits (Telemedicine).  Patients are able to view lab/test results, encounter notes, upcoming appointments, etc.  Non-urgent messages can be sent to your provider as well.   To learn more about what you can do with MyChart, go to [email or web address].    Your next appointment:    4-5 months with Dr. Tresa Endo Other Instructions  LIPID CLINIC consult with clinical pharmacist to discuss Repatha OR Praluent in about 3-4 weeks

## 2022-07-27 ENCOUNTER — Encounter: Payer: Self-pay | Admitting: Cardiovascular Disease

## 2022-08-03 ENCOUNTER — Encounter: Payer: Self-pay | Admitting: Cardiovascular Disease

## 2022-08-03 DIAGNOSIS — Z8249 Family history of ischemic heart disease and other diseases of the circulatory system: Secondary | ICD-10-CM | POA: Diagnosis not present

## 2022-08-03 DIAGNOSIS — J329 Chronic sinusitis, unspecified: Secondary | ICD-10-CM | POA: Diagnosis not present

## 2022-08-03 DIAGNOSIS — E559 Vitamin D deficiency, unspecified: Secondary | ICD-10-CM | POA: Diagnosis not present

## 2022-08-03 DIAGNOSIS — R7989 Other specified abnormal findings of blood chemistry: Secondary | ICD-10-CM | POA: Diagnosis not present

## 2022-08-03 DIAGNOSIS — E039 Hypothyroidism, unspecified: Secondary | ICD-10-CM | POA: Diagnosis not present

## 2022-08-03 DIAGNOSIS — E7800 Pure hypercholesterolemia, unspecified: Secondary | ICD-10-CM | POA: Diagnosis not present

## 2022-08-03 DIAGNOSIS — R7303 Prediabetes: Secondary | ICD-10-CM | POA: Diagnosis not present

## 2022-08-12 DIAGNOSIS — E7841 Elevated Lipoprotein(a): Secondary | ICD-10-CM | POA: Diagnosis not present

## 2022-08-12 DIAGNOSIS — E7800 Pure hypercholesterolemia, unspecified: Secondary | ICD-10-CM | POA: Diagnosis not present

## 2022-08-12 DIAGNOSIS — R931 Abnormal findings on diagnostic imaging of heart and coronary circulation: Secondary | ICD-10-CM | POA: Diagnosis not present

## 2022-08-12 DIAGNOSIS — R748 Abnormal levels of other serum enzymes: Secondary | ICD-10-CM | POA: Diagnosis not present

## 2022-08-13 LAB — COMPREHENSIVE METABOLIC PANEL
ALT: 33 IU/L — ABNORMAL HIGH (ref 0–32)
AST: 23 IU/L (ref 0–40)
Albumin/Globulin Ratio: 2 (ref 1.2–2.2)
Albumin: 4.3 g/dL (ref 3.9–4.9)
Alkaline Phosphatase: 105 IU/L (ref 44–121)
BUN/Creatinine Ratio: 15 (ref 12–28)
BUN: 12 mg/dL (ref 8–27)
Bilirubin Total: <0.2 mg/dL (ref 0.0–1.2)
CO2: 25 mmol/L (ref 20–29)
Calcium: 9.6 mg/dL (ref 8.7–10.3)
Chloride: 104 mmol/L (ref 96–106)
Creatinine, Ser: 0.79 mg/dL (ref 0.57–1.00)
Globulin, Total: 2.1 g/dL (ref 1.5–4.5)
Glucose: 91 mg/dL (ref 70–99)
Potassium: 5.1 mmol/L (ref 3.5–5.2)
Sodium: 141 mmol/L (ref 134–144)
Total Protein: 6.4 g/dL (ref 6.0–8.5)
eGFR: 84 mL/min/{1.73_m2} (ref >59)

## 2022-08-22 ENCOUNTER — Telehealth: Payer: Self-pay | Admitting: Cardiovascular Disease

## 2022-08-22 NOTE — Telephone Encounter (Signed)
Called pt to let her know she Dr. Tresa Endo would like her to see the pharmacist in order to discuss PCSK9 inhoibitors Per Dr. Landry Dyke notes:  I also have discussed with the Juliene Pina, our pharmacist concerning initiation of PCSK9 inhibition with Repatha which should potentially also reduce LP(a) by approximately 26 to 30%.  I will arrange a follow-up appointment for her to see our pharmacist in 1 month for initiation of Repatha.  Will see her in 4 to 5 months for follow-up evaluation.    Pt states "we I'm not sure if my insurance will cover it.If they don't I'm sure it's like $200-300, right?" Pt advised I don not know how mucha  visit with the pharmacist will cost. Pt will call her insurance company to get assistance with pricing.  Pt would also like to know is she to restart the Crestor or not. "I've been waiting to hear back about that. But no one has reached out to me."

## 2022-08-22 NOTE — Telephone Encounter (Signed)
Pt states she is confused about her pharmacist appt coming about and wants to speak with Dr. Landry Dyke nurse about it first before going forward.

## 2022-08-24 NOTE — Telephone Encounter (Signed)
She has history of hyperlipidemia, strong family history for CAD, and markedly elevated LP(a).  Lipid studies have significantly improved with higher dose rosuvastatin but she developed increased LFT elevation.  She would benefit from the addition of PCSK9 in addition which will dramatically reduce her LDL as well as a 26 to 30% reduction in LP(a).  If she does not want to start Repatha, we may need to try low-dose Crestor with bempedoic acid and Zetia

## 2022-08-26 NOTE — Telephone Encounter (Signed)
Called patient, she is going to keep the appointment to see the pharmacy team to discuss different options. She states she just has some questions for them and would like their opinion on what would be best for her.   Advised I would route to our billing team just to see if there was any issues with her insurance for this visit.   Patient verbalized understanding, thankful for call back

## 2022-08-29 ENCOUNTER — Ambulatory Visit: Payer: 59 | Attending: Internal Medicine | Admitting: Pharmacist Clinician (PhC)/ Clinical Pharmacy Specialist

## 2022-08-29 ENCOUNTER — Encounter: Payer: Self-pay | Admitting: Pharmacist Clinician (PhC)/ Clinical Pharmacy Specialist

## 2022-08-29 ENCOUNTER — Telehealth: Payer: Self-pay | Admitting: Pharmacist Clinician (PhC)/ Clinical Pharmacy Specialist

## 2022-08-29 DIAGNOSIS — E785 Hyperlipidemia, unspecified: Secondary | ICD-10-CM

## 2022-08-29 NOTE — Assessment & Plan Note (Addendum)
Assessment: Patient with ASCVD not at LDL goal of < 70 Most recent LDL 70 on 07/20/22 Was on rosuvastatin, at time of blood draw but had to discontinue 2/2 elevation in LFTs to > 3x UNL LFT's back to baseline after discontinuation Not able to tolerate pravastatin secondary to myalgias Reviewed options for lowering LDL cholesterol, including PCSK-9 inhibitors, bempedoic acid and inclisiran.  Discussed mechanisms of action, dosing, side effects, potential decreases in LDL cholesterol and costs.  Also reviewed potential options for patient assistance.  Plan: Will repeat Lipid panel this week - now off statin for 1 month Patient agreeable to starting Repatha 140 mg Repeat labs after:  3 months Lipid Liver function Patient was given information on [employer] copay card.

## 2022-08-29 NOTE — Telephone Encounter (Signed)
Please disregard for now.  Will get updated labs later this week.

## 2022-08-29 NOTE — Patient Instructions (Addendum)
Your Results:             Your most recent labs Goal  Total Cholesterol 143 < 200  Triglycerides 84 < 150  HDL (happy/good cholesterol) 57 > 40  LDL (lousy/bad cholesterol 70 < 70   Medication changes:  After we get the updated labs, we will start the process to get Repatha covered by your insurance.  Once the prior authorization is complete, I will call/send a MyChart message to let you know and confirm pharmacy information.   You will take one injection every 14 days.   Lab orders:  Go to the lab in 1 week to get a repeat on your cholesterol labs.   We want to repeat labs after 2-3 months after starting Repatha.  We will send you a lab order to remind you once we get closer to that time.    Patient Assistance:    Activate co-pay card before taking it to the pharmacy.     Thank you for choosing [facility]

## 2022-08-29 NOTE — Telephone Encounter (Signed)
Please start PA for Repatha.   

## 2022-08-29 NOTE — Progress Notes (Signed)
Office Visit    Patient Name: Meghan Lozano Date of Encounter: 08/29/2022  Primary Care Provider:  Donita Brooks, MD Primary Cardiologist:  Nicki Guadalajara, MD  Chief Complaint    Hyperlipidemia   Significant Past Medical History   CAD 12/23 - 50-69% calcified plaque in LAD at D2  HTN On valsartan 80 mg  hypothyroid TSH stable on levothyroxine 112 mcg  Elevated LFTs Since increasing rosuvastatin - med d/c     Allergies  Allergen Reactions   Other    Betadine [Povidone Iodine] Swelling   Demerol Nausea And Vomiting   Fish Allergy Nausea And Vomiting   Shrimp [Shellfish Allergy] Nausea And Vomiting   Iodine Itching and Rash    History of Present Illness    Meghan Lozano is a 62 y.o. female patient of Dr Tresa Endo, in the office to discuss options for cholesterol management.    Insurance Carrier:  [employer]  LDL Cholesterol goal:  LDL < 70   Current Medications:   ezetimibe 10 mg qd,  Previously tried:  rosuvastatin - increase in ALT to > 3x UNL  Pravastatin - myalgias  Family Hx:  father had CABG x 6 at 71 now 29; paternal uncle, grandfather and great-grandfather al with MI; mother has stent, AF, pacemaker; sisters with hypertension;   Social Hx: Tobacco: no  Alcohol: occasional    Diet:  mostly homed cooked meals, plenty of vegetables, mostly fresh, lots of salads; watches sodium closely   Exercise: walking about 30 minutes most days; chasing grandchildren (10,7,5,2)   Accessory Clinical Findings   04/11/22 - Lp(a)  278.1  Lab Results  Component Value Date   CHOL 143 07/20/2022   HDL 57 07/20/2022   LDLCALC 70 07/20/2022   TRIG 84 07/20/2022   CHOLHDL 2.5 07/20/2022    Lab Results  Component Value Date   ALT 33 (H) 08/12/2022   AST 23 08/12/2022   ALKPHOS 105 08/12/2022   BILITOT <0.2 08/12/2022   Lab Results  Component Value Date   CREATININE 0.79 08/12/2022   BUN 12 08/12/2022   NA 141 08/12/2022   K 5.1 08/12/2022   CL 104  08/12/2022   CO2 25 08/12/2022   No results found for: "HGBA1C"  Home Medications    Current Outpatient Medications  Medication Sig Dispense Refill   ALPRAZolam (XANAX) 0.5 MG tablet TAKE 1 TABLET BY MOUTH 3 TIMES DAILY AS NEEDED. 30 tablet 0   ezetimibe (ZETIA) 10 MG tablet Take 1 tablet (10 mg total) by mouth daily. 90 tablet 3   famotidine (PEPCID) 20 MG tablet Take 1 tablet (20 mg total) by mouth daily as needed for heartburn or indigestion.     metoprolol succinate (TOPROL-XL) 25 MG 24 hr tablet Take 1 tablet (25 mg total) by mouth daily. Take with or immediately following a meal. 90 tablet 3   nitroGLYCERIN (NITROSTAT) 0.4 MG SL tablet Place 1 tablet (0.4 mg total) under the tongue every 5 (five) minutes as needed for chest pain. 25 tablet 11   SYNTHROID 112 MCG tablet TAKE 1 TABLET (112 MCG TOTAL) BY MOUTH DAILY. 30 tablet 3   valsartan (DIOVAN) 160 MG tablet Take 80 mg by mouth daily. Take 1/2  tablet by mouth daily     VITAMIN D, CHOLECALCIFEROL, PO Take 1 capsule by mouth daily.     zolpidem (AMBIEN) 10 MG tablet Take 5 mg by mouth at bedtime as needed for sleep.     No  current facility-administered medications for this visit.     Assessment & Plan    Hyperlipidemia Assessment: Patient with ASCVD not at LDL goal of < 70 Most recent LDL 70 on 07/20/22 Was on rosuvastatin, at time of blood draw but had to discontinue 2/2 elevation in LFTs to > 3x UNL LFT's back to baseline after discontinuation Not able to tolerate pravastatin secondary to myalgias Reviewed options for lowering LDL cholesterol, including PCSK-9 inhibitors, bempedoic acid and inclisiran.  Discussed mechanisms of action, dosing, side effects, potential decreases in LDL cholesterol and costs.  Also reviewed potential options for patient assistance.  Plan: Patient agreeable to starting Repatha 140 mg Repeat labs after:  3 months Lipid Liver function Patient was given information on [employer] copay card.      Phillips Hay, PharmD CPP [facility] [address]  [place], [place] [address] [phone]  08/29/2022, 11:22 AM

## 2022-09-09 ENCOUNTER — Other Ambulatory Visit: Payer: Self-pay

## 2022-09-09 DIAGNOSIS — E785 Hyperlipidemia, unspecified: Secondary | ICD-10-CM | POA: Diagnosis not present

## 2022-09-09 LAB — LIPID PANEL
Chol/HDL Ratio: 4.1 ratio (ref 0.0–4.4)
Cholesterol, Total: 227 mg/dL — ABNORMAL HIGH (ref 100–199)
HDL: 55 mg/dL (ref >39)
LDL Chol Calc (NIH): 156 mg/dL — ABNORMAL HIGH (ref 0–99)
Triglycerides: 89 mg/dL (ref 0–149)
VLDL Cholesterol Cal: 16 mg/dL (ref 5–40)

## 2022-09-20 ENCOUNTER — Other Ambulatory Visit (HOSPITAL_COMMUNITY): Payer: Self-pay

## 2022-09-20 ENCOUNTER — Telehealth: Payer: Self-pay

## 2022-09-20 NOTE — Telephone Encounter (Signed)
Pharmacy Patient Advocate Encounter   Received notification from [employer] that prior authorization for REPATHA is required/requested.   PA submitted to [facility] via CoverMyMeds Key or (Medicaid) confirmation # [id number]  Status is pending

## 2022-09-20 NOTE — Telephone Encounter (Signed)
Labs updated, off statin 2/2 increase in LFT's.  Please do PA for Repatha

## 2022-09-21 ENCOUNTER — Encounter: Payer: Self-pay | Admitting: Pharmacist Clinician (PhC)/ Clinical Pharmacy Specialist

## 2022-09-26 NOTE — Telephone Encounter (Signed)
Addt information for this patient has been received and completed and faxed back to the pts plan via CMM as of 7/8

## 2022-09-30 ENCOUNTER — Other Ambulatory Visit (HOSPITAL_COMMUNITY): Payer: Self-pay

## 2022-09-30 MED ORDER — REPATHA SURECLICK 140 MG/ML ~~LOC~~ SOAJ: 140.0000 mg | SUBCUTANEOUS | 3 refills | Status: DC

## 2022-09-30 NOTE — Telephone Encounter (Signed)
Pharmacy Patient Advocate Encounter  Received notification /called into pt plan to confirm with  [employer] that Prior Authorization for REPATHAhas been APPROVED from 7.12.24 to 7.12.25.  PA #/Case ID/Reference #: [id number]

## 2022-09-30 NOTE — Telephone Encounter (Signed)
Would like a callback regarding Prior Auth and documents that were not received. Please advise

## 2022-09-30 NOTE — Telephone Encounter (Signed)
Hi [place]  Can you see if this one ever got an answer?

## 2022-09-30 NOTE — Telephone Encounter (Signed)
Repatha approved

## 2022-10-05 DIAGNOSIS — G4700 Insomnia, unspecified: Secondary | ICD-10-CM | POA: Diagnosis not present

## 2022-10-05 DIAGNOSIS — E669 Obesity, unspecified: Secondary | ICD-10-CM | POA: Diagnosis not present

## 2022-10-05 DIAGNOSIS — Z6829 Body mass index (BMI) 29.0-29.9, adult: Secondary | ICD-10-CM | POA: Diagnosis not present

## 2022-10-05 DIAGNOSIS — Z01419 Encounter for gynecological examination (general) (routine) without abnormal findings: Secondary | ICD-10-CM | POA: Diagnosis not present

## 2022-10-05 DIAGNOSIS — R319 Hematuria, unspecified: Secondary | ICD-10-CM | POA: Diagnosis not present

## 2022-10-05 DIAGNOSIS — E785 Hyperlipidemia, unspecified: Secondary | ICD-10-CM | POA: Diagnosis not present

## 2022-10-05 DIAGNOSIS — K602 Anal fissure, unspecified: Secondary | ICD-10-CM | POA: Diagnosis not present

## 2022-10-05 DIAGNOSIS — N898 Other specified noninflammatory disorders of vagina: Secondary | ICD-10-CM | POA: Diagnosis not present

## 2022-12-14 ENCOUNTER — Encounter: Payer: Self-pay | Admitting: Pharmacist Clinician (PhC)/ Clinical Pharmacy Specialist

## 2022-12-14 DIAGNOSIS — R748 Abnormal levels of other serum enzymes: Secondary | ICD-10-CM

## 2022-12-14 DIAGNOSIS — E785 Hyperlipidemia, unspecified: Secondary | ICD-10-CM

## 2022-12-14 DIAGNOSIS — I2510 Atherosclerotic heart disease of native coronary artery without angina pectoris: Secondary | ICD-10-CM

## 2022-12-14 DIAGNOSIS — E7841 Elevated Lipoprotein(a): Secondary | ICD-10-CM

## 2022-12-19 LAB — COMPREHENSIVE METABOLIC PANEL
ALT: 34 [IU]/L — ABNORMAL HIGH (ref 0–32)
AST: 25 [IU]/L (ref 0–40)
Albumin: 4.5 g/dL (ref 3.9–4.9)
Alkaline Phosphatase: 91 [IU]/L (ref 44–121)
BUN/Creatinine Ratio: 19 (ref 12–28)
BUN: 17 mg/dL (ref 8–27)
Bilirubin Total: 0.4 mg/dL (ref 0.0–1.2)
CO2: 25 mmol/L (ref 20–29)
Calcium: 9.6 mg/dL (ref 8.7–10.3)
Chloride: 103 mmol/L (ref 96–106)
Creatinine, Ser: 0.88 mg/dL (ref 0.57–1.00)
Globulin, Total: 2.2 g/dL (ref 1.5–4.5)
Glucose: 88 mg/dL (ref 70–99)
Potassium: 5.1 mmol/L (ref 3.5–5.2)
Sodium: 141 mmol/L (ref 134–144)
Total Protein: 6.7 g/dL (ref 6.0–8.5)
eGFR: 74 mL/min/{1.73_m2} (ref >59)

## 2022-12-19 LAB — CBC
Hematocrit: 43.9 % (ref 34.0–46.6)
Hemoglobin: 14.3 g/dL (ref 11.1–15.9)
MCH: 29.2 pg (ref 26.6–33.0)
MCHC: 32.6 g/dL (ref 31.5–35.7)
MCV: 90 fL (ref 79–97)
Platelets: 341 10*3/uL (ref 150–450)
RBC: 4.9 x10E6/uL (ref 3.77–5.28)
RDW: 11.9 % (ref 11.7–15.4)
WBC: 6.3 10*3/uL (ref 3.4–10.8)

## 2022-12-19 LAB — LIPID PANEL
Chol/HDL Ratio: 2.3 {ratio} (ref 0.0–4.4)
Cholesterol, Total: 117 mg/dL (ref 100–199)
HDL: 51 mg/dL (ref >39)
LDL Chol Calc (NIH): 47 mg/dL (ref 0–99)
Triglycerides: 100 mg/dL (ref 0–149)
VLDL Cholesterol Cal: 19 mg/dL (ref 5–40)

## 2022-12-19 LAB — LIPOPROTEIN A (LPA): Lipoprotein (a): 233.1 nmol/L — ABNORMAL HIGH (ref <75.0)

## 2022-12-21 ENCOUNTER — Ambulatory Visit: Payer: 59 | Attending: Cardiovascular Disease | Admitting: Cardiovascular Disease

## 2022-12-21 ENCOUNTER — Encounter: Payer: Self-pay | Admitting: Cardiovascular Disease

## 2022-12-21 DIAGNOSIS — Z8249 Family history of ischemic heart disease and other diseases of the circulatory system: Secondary | ICD-10-CM

## 2022-12-21 DIAGNOSIS — Z131 Encounter for screening for diabetes mellitus: Secondary | ICD-10-CM

## 2022-12-21 DIAGNOSIS — E039 Hypothyroidism, unspecified: Secondary | ICD-10-CM

## 2022-12-21 DIAGNOSIS — E7841 Elevated Lipoprotein(a): Secondary | ICD-10-CM | POA: Diagnosis not present

## 2022-12-21 DIAGNOSIS — I10 Essential (primary) hypertension: Secondary | ICD-10-CM | POA: Diagnosis not present

## 2022-12-21 DIAGNOSIS — R931 Abnormal findings on diagnostic imaging of heart and coronary circulation: Secondary | ICD-10-CM

## 2022-12-21 DIAGNOSIS — E785 Hyperlipidemia, unspecified: Secondary | ICD-10-CM

## 2022-12-21 NOTE — Progress Notes (Signed)
Cardiology Office Note    Date:  12/23/2022   ID:  Meghan Lozano, DOB 10-12-1960, MRN [id number]  PCP:  Donita Brooks, MD  Cardiologist:  Nicki Guadalajara, MD   5 month F/U  cardiology evaluation initially referred through the courtesy of Dr. Lynnea Ferrier  History of Present Illness:  Meghan Lozano is a 62 y.o. female who is followed by Dr. Tanya Nones for primary care. I saw her for my initial evaluation of February 16, 2022.  I last saw her on Jul 25, 2022.  She presents for a 75-month follow-up evaluation.   Meghan Lozano  has a history of hypothyroidism and has been on levothyroxine replacement since 2009.  She is the daughter of my patients [name] and [name]. She was recently seen by Dr. Ocie Cornfield at [facility] physicians for endocrinologic evaluation.  She underwent laboratory on January 31, 2022 which showed a total cholesterol 191, HDL 56, LDL 117, triglycerides 107.  In October 2023 TSH was 3.05.  She had normal renal function with creatinine 0.81.  Recently, she has has noticed development of shortness of breath which has progressed and is evident particularly while walking up steps.  She does not have much energy.  Recently, she has had labile blood pressure with blood pressure elevating to 165/110 in October 2023.  She states that that was the first she became aware of blood pressure elevation.  At that time she was started on low-dose valsartan 80 mg and also rosuvastatin 5 mg.  She continues to be on levothyroxine 112 mcg.  She admits to some weight gain.  She denies any presyncope or syncope.  She is unaware of any palpitations.  She denies orthostatic symptoms but at times has noted some vague chest heaviness associated with her shortness of breath and pain in her shoulder.  With her recent symptomatology, she was referred for cardiology evaluation.  During my initial evaluation, I recommended that she undergo a 2D echo Doppler study to assess LV systolic and diastolic  function and valvular architecture.  With her significant family history for CAD with her father undergoing CABG revascularization and PCI and her mother who also underwent PCI, I recommended she undergo coronary CTA for assessment of calcium score and potential intraluminal coronary stenosis.  I recommended she initiate metoprolol succinate 12.5 mg both for more optimal blood pressure control and potential underlying CAD.  I recommended she titrate her very low-dose rosuvastatin to at least 10 mg but suspected further titration will be necessary.  She was to undergo follow-up endocrinologic evaluation on her current levothyroxine dose.  On March 02, 2022 she underwent coronary CTA.  Calcium score was 144, representing 89th percentile.  There was mild left main calcified plaque of 1 to 24% and in the LAD 50 to 69% calcified plaque at the takeoff of the second diagonal vessel.  She had normal ascending thoracic aorta at 3.3 cm.  A 2D echo Doppler study on March 11, 2022 showed normal LV function with EF 60 to 65%, mild MR, and mild aortic sclerosis without stenosis.  I saw her for initial follow-up on April 13, 2022 at which time she felt well.  Incidentally noted on her chest CT over read was a moderately large hiatal hernia.  She denies any chest pain or shortness of breath.  She underwent laboratory prior to this office visit.  Lipid studies were improved but LDL was 93 and total cholesterol 157.  LP(a) was  elevated at 278.1.  At times she experiences some dyspepsia.  At evaluation, parents having established coronary artery disease significant elevation of LP(a) potentially contributing to vulnerable plaque, I recommended further titration of rosuvastatin to 20 mg and added Zetia 10 mg with target LDL ideally less than 50.  I also further titrated metoprolol succinate to 25 mg.  I last saw her on Jul 25, 2022 at which time she felt well.  She underwent follow-up laboratory on Jul 20, 2022.  Lipid  studies had improved and total cholesterol was 143, triglycerides 84, HDL 57, and LDL 70.  However, she states recently she had significant ear congestion and infection and was treated with antibiotics.  Most recent chemistry now reveals significant increase in ALT from 39 up to 123 with normal AST at 39.  During that evaluation, with her elevated LFTs I recommended she discontinue rosuvastatin.  With her markedly elevated LP(a) and significant family history for CAD I recommended initiation of PCSK9 inhibition with Repatha which should also reduce LP(a) by approximately 26 to 30%.  Meghan Lozano was started on Repatha therapy.  Presently she feels well.  Apparently she had stopped her Zetia at the time she stopped her statin once Repatha was started.  Repeat laboratory in December 16, 2022 showed total cholesterol 117, HDL 51, LDL 47 with triglycerides 161.  She continues to be on valsartan 80 mg daily for hypertension.  Her blood pressure has been stable.  Renal function is stable with creatinine 0.88.  Potassium was 5.1.  She denies chest pain or shortness of breath.  She presents for evaluation.  Past Medical History:  Diagnosis Date   Anxiety    Phreesia 07/18/2020   Arthritis    bilateral hands and neck   Elevated LFTs    Hyperlipidemia    Hypertension 12/2022   Hypothyroidism    Hypothyroidism    Thyroid disease    [employer]reesia 07/18/2020    Past Surgical History:  Procedure Laterality Date   FUNCTIONAL ENDOSCOPIC SINUS SURGERY  1981   GANGLION CYST EXCISION Right 1975   wrist   TONSILLECTOMY AND ADENOIDECTOMY  1966    Current Medications: Outpatient Medications Prior to Visit  Medication Sig Dispense Refill   ALPRAZolam (XANAX) 0.5 MG tablet TAKE 1 TABLET BY MOUTH 3 TIMES DAILY AS NEEDED. 30 tablet 0   Evolocumab (REPATHA SURECLICK) 140 MG/ML SOAJ Inject 140 mg into the skin every 14 (fourteen) days. 6 mL 3   famotidine (PEPCID) 20 MG tablet Take 1 tablet (20 mg total) by mouth  daily as needed for heartburn or indigestion.     metoprolol succinate (TOPROL-XL) 25 MG 24 hr tablet Take 1 tablet (25 mg total) by mouth daily. Take with or immediately following a meal. 90 tablet 3   nitroGLYCERIN (NITROSTAT) 0.4 MG SL tablet Place 1 tablet (0.4 mg total) under the tongue every 5 (five) minutes as needed for chest pain. 25 tablet 11   SYNTHROID 112 MCG tablet TAKE 1 TABLET (112 MCG TOTAL) BY MOUTH DAILY. 30 tablet 3   valsartan (DIOVAN) 160 MG tablet Take 80 mg by mouth daily. Take 1/2  tablet by mouth daily     VITAMIN D, CHOLECALCIFEROL, PO Take 1 capsule by mouth daily.     zolpidem (AMBIEN) 10 MG tablet Take 5 mg by mouth at bedtime as needed for sleep.     ezetimibe (ZETIA) 10 MG tablet Take 1 tablet (10 mg total) by mouth daily. (Patient not taking: Reported on 12/21/2022)  90 tablet 3   No facility-administered medications prior to visit.     Allergies:   Other, Betadine [povidone iodine], Demerol, Fish allergy, Shrimp [shellfish allergy], and Iodine   Social History   Socioeconomic History   Marital status: Married    Spouse name: Not on file   Number of children: Not on file   Years of education: Not on file   Highest education level: Not on file  Occupational History   Not on file  Tobacco Use   Smoking status: Never   Smokeless tobacco: Never  Substance and Sexual Activity   Alcohol use: Yes    Alcohol/week: 3.0 standard drinks of alcohol    Types: 3 Glasses of wine per week    Comment: Social   Drug use: No   Sexual activity: Not Currently    Birth control/protection: Abstinence    Comment: married to [employer],  Other Topics Concern   Not on file  Social History Narrative   Not on file   Social Determinants of Health   Financial Resource Strain: Not on file  Food Insecurity: Not on file  Transportation Needs: Not on file  Physical Activity: Not on file  Stress: Not on file  Social Connections: Not on file    Social history is notable and  that she is married for 42 years.  She has a 27 year old son and a 36 year old daughter who are healthy.  There is no tobacco history.  She drinks occasional Hall.  She stays home caring caring for her 4 grandchildren.  She went to [employer].  Family History:  The patient's family history includes Arthritis in her father and mother; Cerebral aneurysm in her paternal grandmother; Diabetes in her mother; Heart attack in her father; Heart disease in her father, maternal grandfather, mother, paternal grandfather, and paternal uncle; Hyperlipidemia in her father and mother; Hypertension in her father, mother, and sister; Skin cancer in her mother; Stroke in her maternal grandmother.  Both parents have CAD.  Her father is 76 and mother 30.  She has 2 sisters who have a history of hypertension and hyperlipidemia.  ROS General: Negative; No fevers, chills, or night sweats;  HEENT: Negative; No changes in vision or hearing, sinus congestion, difficulty swallowing Pulmonary: Negative; No cough, wheezing, shortness of breath, hemoptysis Cardiovascular: See HPI GI: Negative; No nausea, vomiting, diarrhea, or abdominal pain GU: Negative; No dysuria, hematuria, or difficulty voiding Musculoskeletal: Negative; no myalgias, joint pain, or weakness Hematologic/Oncology: Negative; no easy bruising, bleeding Endocrine: Negative; no heat/cold intolerance; no diabetes Neuro: Negative; no changes in balance, headaches Skin: Negative; No rashes or skin lesions Psychiatric: Negative; No behavioral problems, depression Sleep: Negative; No snoring, daytime sleepiness, hypersomnolence, bruxism, restless legs, hypnogognic hallucinations, no cataplexy Other comprehensive 14 point system review is negative.   PHYSICAL EXAM:   VS:  BP 126/74   Pulse 68   Ht 5\' 2"  (1.575 m)   Wt 158 lb 12.8 oz (72 kg)   SpO2 98%   BMI 29.04 kg/m     Repeat blood pressure by me was 124/70  Wt Readings from Last 3 Encounters:  12/21/22  158 lb 12.8 oz (72 kg)  07/25/22 159 lb 3.2 oz (72.2 kg)  06/22/22 157 lb (71.2 kg)    General: Alert, oriented, no distress.  Skin: normal turgor, no rashes, warm and dry HEENT: Normocephalic, atraumatic. Pupils equal round and reactive to light; sclera anicteric; extraocular muscles intact;  Nose without nasal septal hypertrophy Mouth/Parynx benign; Mallinpatti scale  3 Neck: No JVD, no carotid bruits; normal carotid upstroke Lungs: clear to ausculatation and percussion; no wheezing or rales Chest wall: without tenderness to palpitation Heart: PMI not displaced, RRR, s1 s2 normal, 1/6 systolic murmur, no diastolic murmur, no rubs, gallops, thrills, or heaves Abdomen: soft, nontender; no hepatosplenomehaly, BS+; abdominal aorta nontender and not dilated by palpation. Back: no CVA tenderness Pulses 2+ Musculoskeletal: full range of motion, normal strength, no joint deformities Extremities: no clubbing cyanosis or edema, Homan's sign negative  Neurologic: grossly nonfocal; Cranial nerves grossly wnl Psychologic: Normal mood and affect   Studies/Labs Reviewed:   EKG Interpretation Date/Time:  Wednesday December 21 2022 08:24:13 EDT Ventricular Rate:  68 PR Interval:  158 QRS Duration:  70 QT Interval:  380 QTC Calculation: 404 R Axis:   45  Text Interpretation: Normal sinus rhythm Normal ECG When compared with ECG of 27-Aug-2011 10:26, No significant change was found Confirmed by Nicki Guadalajara ([id number]) on 12/21/2022 8:53:12 AM    Jul 25, 2022 ECG (independently read by me): NSR at 75, normal intervals  April 13, 2022 ECG (independently read by me): NSR at 73, no ectopy, normal intervals  February 16, 2022 ECG (independently read by me): NSR at 79, no ectopy  Recent Labs:    Latest Ref Rng & Units 12/16/2022    9:13 AM 08/12/2022   11:45 AM 07/20/2022    8:12 AM  BMP  Glucose 70 - 99 mg/dL 88  91  87   BUN 8 - 27 mg/dL 17  12  20    Creatinine 0.57 - 1.00 mg/dL 3.22  0.25  4.27    BUN/Creat Ratio 12 - 28 19  15  24    Sodium 134 - 144 mmol/L 141  141  140   Potassium 3.5 - 5.2 mmol/L 5.1  5.1  5.1   Chloride 96 - 106 mmol/L 103  104  102   CO2 20 - 29 mmol/L 25  25  27    Calcium 8.7 - 10.3 mg/dL 9.6  9.6  9.2         Latest Ref Rng & Units 12/16/2022    9:13 AM 08/12/2022   11:45 AM 07/20/2022    8:12 AM  Hepatic Function  Total Protein 6.0 - 8.5 g/dL 6.7  6.4  6.1   Albumin 3.9 - 4.9 g/dL 4.5  4.3  4.2   AST 0 - 40 IU/L 25  23  39   ALT 0 - 32 IU/L 34  33  123   Alk Phosphatase 44 - 121 IU/L 91  105  96   Total Bilirubin 0.0 - 1.2 mg/dL 0.4  <0.6  0.5        Latest Ref Rng & Units 12/16/2022    9:13 AM 04/11/2022    8:37 AM 01/03/2022    9:42 AM  CBC  WBC 3.4 - 10.8 x10E3/uL 6.3  5.7  7.0   Hemoglobin 11.1 - 15.9 g/dL 23.7  62.8  31.5   Hematocrit 34.0 - 46.6 % 43.9  41.1  44.1   Platelets 150 - 450 x10E3/uL 341  283  349    Lab Results  Component Value Date   MCV 90 12/16/2022   MCV 89 04/11/2022   MCV 88.2 01/03/2022   Lab Results  Component Value Date   TSH 0.936 12/21/2022   Lab Results  Component Value Date   HGBA1C 5.8 (H) 12/21/2022     BNP No results found for: "BNP"  ProBNP  No results found for: "PROBNP"   Lipid Panel     Component Value Date/Time   CHOL 117 12/16/2022 0913   TRIG 100 12/16/2022 0913   HDL 51 12/16/2022 0913   CHOLHDL 2.3 12/16/2022 0913   CHOLHDL 4.0 02/03/2021 0806   VLDL 13 07/08/2016 0841   LDLCALC 47 12/16/2022 0913   LDLCALC 143 (H) 02/03/2021 0806   LABVLDL 19 12/16/2022 0913     RADIOLOGY: No results found.   Additional studies/ records that were reviewed today include:   I reviewed the records of Dr. Tanya Nones and  Deboraha Sprang.  CTA: 03/02/2022 FINDINGS: Non-cardiac: See separate report from [facility]. No significant findings on limited lung and soft tissue windows.   Calcium Score: Calcium noted in LM/LAD   Coronary Arteries: Right dominant with no anomalies   LM: 1-24%  calcified plaque   LAD: 50-69% % calcified plaque at take off of D2   D1: Normal   D2: Normal   Circumflex: Normal   OM1: Normal   OM2: Normal   RCA: Normal   PDA: Normal   PLA:  Normal   IMPRESSION: 1. Calcium score 144 which is 89 th percentile for age/sex   2. CAD RADS 3 calcific plaque in proximal / mid LAD Study sent for FFR   3.  Normal ascending thoracic aorta 3.3 cm  ADDENDUM REPORT: 03/02/2022 11:00   ADDENDUM: OVER-READ INTERPRETATION  CT CHEST   The following report is an over-read performed by radiologist Dr. Lovie Chol [facility] on 03/02/2022. This over-read does not include interpretation of cardiac or coronary anatomy or pathology. The coronary calcium and coronary CT angiography interpretation by the cardiologist is attached. Imaging of the chest is focused on cardiac structures and excludes much of the chest on CT.   COMPARISON:   None available   FINDINGS:   Cardiovascular: See dedicated report for cardiovascular details.   Mediastinum/Nodes: No adenopathy or acute process in the mediastinum.   Lungs/Pleura: Lungs are clear airways are patent to the extent evaluated.   Upper Abdomen: Incidental imaging of upper abdominal contents with moderately large size hiatal hernia likely mixed type hernia.   Musculoskeletal: No acute or destructive bone process.   CHEST CT overread: IMPRESSION: 1. No acute findings in the chest. 2. Incidental imaging of upper abdominal contents with moderately large size hiatal hernia likely mixed type hernia.    ECHO: 03/11/2022 1. Left ventricular ejection fraction, by estimation, is 60 to 65%. The  left ventricle has normal function. The left ventricle has no regional  wall motion abnormalities. Left ventricular diastolic parameters were  normal. The average left ventricular  global longitudinal strain is -28.3 %. The global longitudinal strain is  normal.   2. Right ventricular  systolic function is normal. The right ventricular  size is normal.   3. The mitral valve is normal in structure. Mild mitral valve  regurgitation. No evidence of mitral stenosis.   4. The aortic valve is tricuspid. Aortic valve regurgitation is not  visualized. Aortic valve sclerosis/calcification is present, without any  evidence of aortic stenosis.   5. The inferior vena cava is normal in size with greater than 50%  respiratory variability, suggesting right atrial pressure of 3 mmHg.    ASSESSMENT:    1. Agatston coronary artery calcium score between 100 and 199   2. Hyperlipidemia with target low density lipoprotein (LDL) cholesterol less than 50 mg/dL   3. Elevated Lp(a)   4. Family history of heart  disease   5. Essential hypertension   6. Hypothyroidism, unspecified type   7. Screening for diabetes mellitus (DM)     PLAN:  Meghan Lozano is a very pleasant 62 year old female who is followed by Dr. Tanya Nones for primary care and sees Dr. Garrison Columbus of GYN.  She has a history of hypothyroidism and has been on thyroid replacement since 2009.  In October 2023 she became aware of some blood pressure lability.  She was started on valsartan and also was started on rosuvastatin 5 mg.  Subsequent lab work on January 31, 2022 showed total cholesterol 191 HDL 56 triglycerides 107 LDL 117 and non-HDL 136.  She has experienced shortness of breath with activity and at times has also experienced some vague chest tightness and heaviness.  When I saw her for my initial evaluation on February 16, 2022, blood pressure was elevated.  At that time I initiated low-dose metoprolol succinate at 12.5 mg. A echo Doppler study  confirmed normal LV systolic and diastolic function with mild MR, mild aortic valve sclerosis without stenosis.  Coronary CTA demonstrated a calcium score of 144 representing 89th percentile mild plaque in the left main and moderate in the LAD in the region of the second diagonal takeoff.   FFR analysis was not significant.  At her initial visit I at least recommended titration of rosuvastatin to 10 mg.  Subsequent laboratory has shown some improvement but LDL cholesterol was 93 and  LP(a)  was significantly elevated at 278.1.   I added Zetia 10 mg to an increase rosuvastatin to 20 mg with plan for target LDL less than 50.  I further titrated metoprolol to 25 mg.  Subsequent laboratory on Jul 20, 2022 showed mprovement in lipid studies with LDL cholesterol at 70.  However, ALT was significantly increased at 123 which may or may not be due to the increase rosuvastatin and possibly due to recent infection and antibiotic treatment.  She was advised at that time to discontinue rosuvastatin.  With her significant family history and marked elevation of LP(a) I recommended initiation of Repatha.  She has been on therapy for several months.  LDL cholesterol is now excellent at 47.  She apparently also has stopped her Zetia and has not taken this for several months.  As long as her LDL remains under 50 we will continue with just Repatha alone however if LDL increases in the future Zetia will be reinstituted.  Her blood pressure today is excellent on her current regimen of valsartan 80 mg daily.  She continues to be on levothyroxine 112 mcg for hyperlipidemia.  She continues to be on metoprolol succinate 20 mg.  She takes Pepcid on an as-needed basis for GERD.  Apparently her endocrinologist has not been available to see her.  I will check a TSH and hemoglobin A1c today.  In 6 months I have recommended follow-up laboratory with a comprehensive metabolic panel, TSH, CBC and lipid studies.  I will see her in April 2025 for follow-up evaluation or sooner as needed.    Medication Adjustments/Labs and Tests Ordered: Current medicines are reviewed at length with the patient today.  Concerns regarding medicines are outlined above.  Medication changes, Labs and Tests ordered today are listed in the Patient  Instructions below. Patient Instructions  Medication Instructions:  NO MEDICATION CHANGES *If you need a refill on your cardiac medications before your next appointment, please call your pharmacy*   Lab Work: TSH, HEMOGLOBIN A1C  IN [facility] OF 2025, YOU  WILL RETURN FOR FASTING LABS CMET, TSH, CBC, LIPIDS   If you have labs (blood work) drawn today and your tests are completely normal, you will receive your results only by: MyChart Message (if you have MyChart) OR A paper copy in the mail If you have any lab test that is abnormal or we need to change your treatment, we will call you to review the results.   Testing/Procedures: NONE   Follow-Up: At [facility], you and your health needs are our priority.  As part of our continuing mission to provide you with exceptional heart care, we have created designated Provider Care Teams.  These Care Teams include your primary Cardiologist (physician) and Advanced Practice Providers (APPs -  Physician Assistants and Nurse Practitioners) who all work together to provide you with the care you need, when you need it.  We recommend signing up for the patient portal called "MyChart".  Sign up information is provided on this After Visit Summary.  MyChart is used to connect with patients for Virtual Visits (Telemedicine).  Patients are able to view lab/test results, encounter notes, upcoming appointments, etc.  Non-urgent messages can be sent to your provider as well.   To learn more about what you can do with MyChart, go to [email or web address].    Your next appointment:   6 month(s)  Provider:   Nicki Guadalajara, MD        Signed, Nicki Guadalajara, MD  12/23/2022 12:33 PM    [facility] [address], [place], [place]  [address] Phone: [phone]

## 2022-12-21 NOTE — Patient Instructions (Signed)
Medication Instructions:  NO MEDICATION CHANGES *If you need a refill on your cardiac medications before your next appointment, please call your pharmacy*   Lab Work: TSH, HEMOGLOBIN A1C  IN MARCH OF 2025, YOU WILL RETURN FOR FASTING LABS CMET, TSH, CBC, LIPIDS   If you have labs (blood work) drawn today and your tests are completely normal, you will receive your results only by: MyChart Message (if you have MyChart) OR A paper copy in the mail If you have any lab test that is abnormal or we need to change your treatment, we will call you to review the results.   Testing/Procedures: NONE   Follow-Up: At [facility], you and your health needs are our priority.  As part of our continuing mission to provide you with exceptional heart care, we have created designated Provider Care Teams.  These Care Teams include your primary Cardiologist (physician) and Advanced Practice Providers (APPs -  Physician Assistants and Nurse Practitioners) who all work together to provide you with the care you need, when you need it.  We recommend signing up for the patient portal called "MyChart".  Sign up information is provided on this After Visit Summary.  MyChart is used to connect with patients for Virtual Visits (Telemedicine).  Patients are able to view lab/test results, encounter notes, upcoming appointments, etc.  Non-urgent messages can be sent to your provider as well.   To learn more about what you can do with MyChart, go to [email or web address].    Your next appointment:   6 month(s)  Provider:   Nicki Guadalajara, MD

## 2022-12-22 LAB — HEMOGLOBIN A1C
Est. average glucose Bld gHb Est-mCnc: 120 mg/dL
Hgb A1c MFr Bld: 5.8 % — ABNORMAL HIGH (ref 4.8–5.6)

## 2022-12-22 LAB — TSH: TSH: 0.936 u[IU]/mL (ref 0.450–4.500)

## 2022-12-23 ENCOUNTER — Encounter: Payer: Self-pay | Admitting: Cardiovascular Disease

## 2023-01-11 ENCOUNTER — Telehealth: Payer: Self-pay | Admitting: Pharmacist

## 2023-01-11 NOTE — Telephone Encounter (Signed)
[facility] submitted per pt MyChart message request. Key [id number]. Waiting for clinical questions to populate.

## 2023-01-11 NOTE — Telephone Encounter (Signed)
Clinical questions have populated, request has been submitted.

## 2023-01-16 NOTE — Telephone Encounter (Signed)
Prior auth denied, pt notified by [employer].

## 2023-01-20 ENCOUNTER — Encounter: Payer: Self-pay | Admitting: Cardiovascular Disease

## 2023-01-20 NOTE — Telephone Encounter (Signed)
Spoke with pt's husband. He will call back on Monday for nx available appt. Pt was currently driving.

## 2023-02-20 DIAGNOSIS — R7989 Other specified abnormal findings of blood chemistry: Secondary | ICD-10-CM | POA: Diagnosis not present

## 2023-02-20 DIAGNOSIS — F411 Generalized anxiety disorder: Secondary | ICD-10-CM | POA: Diagnosis not present

## 2023-02-20 DIAGNOSIS — E039 Hypothyroidism, unspecified: Secondary | ICD-10-CM | POA: Diagnosis not present

## 2023-02-20 DIAGNOSIS — E559 Vitamin D deficiency, unspecified: Secondary | ICD-10-CM | POA: Diagnosis not present

## 2023-02-20 DIAGNOSIS — E669 Obesity, unspecified: Secondary | ICD-10-CM | POA: Diagnosis not present

## 2023-02-20 DIAGNOSIS — E7800 Pure hypercholesterolemia, unspecified: Secondary | ICD-10-CM | POA: Diagnosis not present

## 2023-02-20 DIAGNOSIS — R7303 Prediabetes: Secondary | ICD-10-CM | POA: Diagnosis not present

## 2023-02-21 ENCOUNTER — Encounter: Payer: Self-pay | Admitting: Cardiovascular Disease

## 2023-02-21 MED ORDER — VALSARTAN 160 MG PO TABS: 80.0000 mg | ORAL_TABLET | Freq: Every day | ORAL | 3 refills | Status: DC

## 2023-04-10 ENCOUNTER — Other Ambulatory Visit: Payer: Self-pay | Admitting: Cardiovascular Disease

## 2023-04-10 DIAGNOSIS — I10 Essential (primary) hypertension: Secondary | ICD-10-CM

## 2023-06-12 ENCOUNTER — Encounter: Payer: Self-pay | Admitting: Cardiovascular Disease

## 2023-06-13 ENCOUNTER — Other Ambulatory Visit: Payer: Self-pay

## 2023-06-13 DIAGNOSIS — I10 Essential (primary) hypertension: Secondary | ICD-10-CM

## 2023-06-13 DIAGNOSIS — E039 Hypothyroidism, unspecified: Secondary | ICD-10-CM

## 2023-06-13 DIAGNOSIS — E785 Hyperlipidemia, unspecified: Secondary | ICD-10-CM

## 2023-06-19 DIAGNOSIS — I10 Essential (primary) hypertension: Secondary | ICD-10-CM | POA: Diagnosis not present

## 2023-06-19 DIAGNOSIS — E039 Hypothyroidism, unspecified: Secondary | ICD-10-CM | POA: Diagnosis not present

## 2023-06-19 DIAGNOSIS — E785 Hyperlipidemia, unspecified: Secondary | ICD-10-CM | POA: Diagnosis not present

## 2023-06-19 LAB — CBC

## 2023-06-20 LAB — COMPREHENSIVE METABOLIC PANEL WITH GFR
ALT: 23 IU/L (ref 0–32)
AST: 22 IU/L (ref 0–40)
Albumin: 4.4 g/dL (ref 3.9–4.9)
Alkaline Phosphatase: 89 IU/L (ref 44–121)
BUN/Creatinine Ratio: 19 (ref 12–28)
BUN: 16 mg/dL (ref 8–27)
Bilirubin Total: 0.4 mg/dL (ref 0.0–1.2)
CO2: 24 mmol/L (ref 20–29)
Calcium: 9.5 mg/dL (ref 8.7–10.3)
Chloride: 104 mmol/L (ref 96–106)
Creatinine, Ser: 0.83 mg/dL (ref 0.57–1.00)
Globulin, Total: 2 g/dL (ref 1.5–4.5)
Glucose: 92 mg/dL (ref 70–99)
Potassium: 5.3 mmol/L — ABNORMAL HIGH (ref 3.5–5.2)
Sodium: 141 mmol/L (ref 134–144)
Total Protein: 6.4 g/dL (ref 6.0–8.5)
eGFR: 79 mL/min/{1.73_m2} (ref >59)

## 2023-06-20 LAB — LIPID PANEL
Chol/HDL Ratio: 2.1 ratio (ref 0.0–4.4)
Cholesterol, Total: 121 mg/dL (ref 100–199)
HDL: 59 mg/dL (ref >39)
LDL Chol Calc (NIH): 47 mg/dL (ref 0–99)
Triglycerides: 74 mg/dL (ref 0–149)
VLDL Cholesterol Cal: 15 mg/dL (ref 5–40)

## 2023-06-20 LAB — CBC
Hematocrit: 42.7 % (ref 34.0–46.6)
Hemoglobin: 13.9 g/dL (ref 11.1–15.9)
MCH: 29.8 pg (ref 26.6–33.0)
MCHC: 32.6 g/dL (ref 31.5–35.7)
MCV: 92 fL (ref 79–97)
Platelets: 278 10*3/uL (ref 150–450)
RBC: 4.66 x10E6/uL (ref 3.77–5.28)
RDW: 12 % (ref 11.7–15.4)
WBC: 6 10*3/uL (ref 3.4–10.8)

## 2023-06-20 LAB — TSH: TSH: 2.08 u[IU]/mL (ref 0.450–4.500)

## 2023-06-22 ENCOUNTER — Ambulatory Visit: Payer: 59 | Attending: Cardiovascular Disease | Admitting: Cardiovascular Disease

## 2023-06-22 ENCOUNTER — Encounter: Payer: Self-pay | Admitting: Cardiovascular Disease

## 2023-06-22 VITALS — BP 132/9 | HR 75 | Ht 62.0 in | Wt 157.0 lb

## 2023-06-22 DIAGNOSIS — Z8249 Family history of ischemic heart disease and other diseases of the circulatory system: Secondary | ICD-10-CM | POA: Diagnosis not present

## 2023-06-22 DIAGNOSIS — E7841 Elevated Lipoprotein(a): Secondary | ICD-10-CM | POA: Diagnosis not present

## 2023-06-22 DIAGNOSIS — E039 Hypothyroidism, unspecified: Secondary | ICD-10-CM

## 2023-06-22 DIAGNOSIS — E785 Hyperlipidemia, unspecified: Secondary | ICD-10-CM

## 2023-06-22 DIAGNOSIS — R931 Abnormal findings on diagnostic imaging of heart and coronary circulation: Secondary | ICD-10-CM | POA: Diagnosis not present

## 2023-06-22 NOTE — Patient Instructions (Signed)
 Medication Instructions:  No medication changes were made during today's visit.  *If you need a refill on your cardiac medications before your next appointment, please call your pharmacy*   Lab Work: No labs were ordered during today's visit.  If you have labs (blood work) drawn today and your tests are completely normal, you will receive your results only by: MyChart Message (if you have MyChart) OR A paper copy in the mail If you have any lab test that is abnormal or we need to change your treatment, we will call you to review the results.   Testing/Procedures: No procedures were ordered during today's visit.    Follow-Up: At [facility], you and your health needs are our priority.  As part of our continuing mission to provide you with exceptional heart care, we have created designated Provider Care Teams.  These Care Teams include your primary Cardiologist (physician) and Advanced Practice Providers (APPs -  Physician Assistants and Nurse Practitioners) who all work together to provide you with the care you need, when you need it.  We recommend signing up for the patient portal called "MyChart".  Sign up information is provided on this After Visit Summary.  MyChart is used to connect with patients for Virtual Visits (Telemedicine).  Patients are able to view lab/test results, encounter notes, upcoming appointments, etc.  Non-urgent messages can be sent to your provider as well.   To learn more about what you can do with MyChart, go to [email or web address].    Your next appointment:   6 month(s)  Provider:   Dr. Thurmon Fair     Other Instructions [facility]  [address] [place], [place] [address] [address] [phone]       1st Floor: - Lobby - Registration  - Pharmacy  - Lab - Cafe   2nd Floor: - PV Lab - Diagnostic Testing (echo, CT, nuclear med)   3rd Floor: - Vacant   4th Floor: - TCTS (cardiothoracic surgery) -  [facility] - [facility] - Vascular Surgery  - Vascular Ultrasound   5th Floor: - [facility] (general and EP) - Clinical Pharmacy for coumadin, hypertension, lipid, weight-loss medications, and med management appointments      Valet parking services will be available as well.

## 2023-06-22 NOTE — Progress Notes (Signed)
 Cardiology Office Note    Date:  06/26/2023   ID:  DEBRALEE Lozano, DOB 11-30-1960, MRN [id number]  PCP:  Donita Brooks, MD  Cardiologist:  Nicki Guadalajara, MD   6 month F/U  cardiology evaluation initially referred through the courtesy of Dr. Lynnea Ferrier  History of Present Illness:  Meghan Lozano is a 63 y.o. female who is followed by Dr. Tanya Nones for primary care. I saw her for my initial evaluation of February 16, 2022.  I last saw her on December 21, 2022.  She presents for a 28-month follow-up evaluation.   [name]  has a history of hypothyroidism and has been on levothyroxine replacement since 2009.  She is the daughter of my patients Meghan Lozano and Meghan Lozano. She was recently seen by Dr. Ocie Cornfield at [facility] for endocrinologic evaluation.  She underwent laboratory on January 31, 2022 which showed a total cholesterol 191, HDL 56, LDL 117, triglycerides 107.  In October 2023 TSH was 3.05.  She had normal renal function with creatinine 0.81.  Recently, she has has noticed development of shortness of breath which has progressed and is evident particularly while walking up steps.  She does not have much energy.  Recently, she has had labile blood pressure with blood pressure elevating to 165/110 in October 2023.  She states that that was the first she became aware of blood pressure elevation.  At that time she was started on low-dose valsartan 80 mg and also rosuvastatin 5 mg.  She continues to be on levothyroxine 112 mcg.  She admits to some weight gain.  She denies any presyncope or syncope.  She is unaware of any palpitations.  She denies orthostatic symptoms but at times has noted some vague chest heaviness associated with her shortness of breath and pain in her shoulder.  With her recent symptomatology, she was referred for cardiology evaluation.  During my initial evaluation, I recommended that she undergo a 2D echo Doppler study to assess LV systolic and diastolic  function and valvular architecture.  With her significant family history for CAD with her father undergoing CABG revascularization and PCI and her mother who also underwent PCI, I recommended she undergo coronary CTA for assessment of calcium score and potential intraluminal coronary stenosis.  I recommended she initiate metoprolol succinate 12.5 mg both for more optimal blood pressure control and potential underlying CAD.  I recommended she titrate her very low-dose rosuvastatin to at least 10 mg but suspected further titration will be necessary.  She was to undergo follow-up endocrinologic evaluation on her current levothyroxine dose.  On March 02, 2022 she underwent coronary CTA.  Calcium score was 144, representing 89th percentile.  There was mild left main calcified plaque of 1 to 24% and in the LAD 50 to 69% calcified plaque at the takeoff of the second diagonal vessel.  She had normal ascending thoracic aorta at 3.3 cm.  A 2D echo Doppler study on March 11, 2022 showed normal LV function with EF 60 to 65%, mild MR, and mild aortic sclerosis without stenosis.  I saw her for initial follow-up on April 13, 2022 at which time she felt well.  Incidentally noted on her chest CT over read was a moderately large hiatal hernia.  She denies any chest pain or shortness of breath.  She underwent laboratory prior to this office visit.  Lipid studies were improved but LDL was 93 and total cholesterol 157.  LP(a) was  elevated at 278.1.  At times she experiences some dyspepsia.  At evaluation, parents having established coronary artery disease significant elevation of LP(a) potentially contributing to vulnerable plaque, I recommended further titration of rosuvastatin to 20 mg and added Zetia 10 mg with target LDL ideally less than 50.  I also further titrated metoprolol succinate to 25 mg.  When I saw her on Jul 25, 2022 she felt well.  She underwent follow-up laboratory on Jul 20, 2022.  Lipid studies had  improved and total cholesterol was 143, triglycerides 84, HDL 57, and LDL 70.  However, she states recently she had significant ear congestion and infection and was treated with antibiotics.  Most recent chemistry now reveals significant increase in ALT from 39 up to 123 with normal AST at 39.  During that evaluation, with her elevated LFTs I recommended she discontinue rosuvastatin.  With her markedly elevated LP(a) and significant family history for CAD I recommended initiation of PCSK9 inhibition with Repatha which should also reduce LP(a) by approximately 26 to 30%.  [name] was started on Repatha therapy.  I last saw her on December 21, 2022 at which time she continued to feel well. .  Apparently she had stopped her Zetia at the time she stopped her statin once Repatha was started.  Repeat laboratory in December 16, 2022 showed total cholesterol 117, HDL 51, LDL 47 with triglycerides 161.  She continues to be on valsartan 80 mg daily for hypertension.  Her blood pressure has been stable.  Renal function is stable with creatinine 0.88.  Potassium was 5.1.  She denies chest pain or shortness of breath.    Since I last saw her, Ms. Grode remains asymptomatic.  Her blood pressure has been stable and she continues to be on valsartan 80 mg daily and metoprolol succinate 25 mg.  She continues to be on  Repatha.  I had discussed that if LDL L had increased greater than 50, Zetia should be resumed.  She is not on statin therapy with her prior LFT elevation.  Most recent lipid studies on June 19, 2023 showed total cholesterol 121, triglycerides 74, HDL 59, and LDL cholesterol at 47.  She presents for evaluation.   Past Medical History:  Diagnosis Date   Anxiety    Phreesia 07/18/2020   Arthritis    bilateral hands and neck   Elevated LFTs    Hyperlipidemia    Hypertension 12/2022   Hypothyroidism    Hypothyroidism    Thyroid disease    Phreesia 07/18/2020    Past Surgical History:  Procedure  Laterality Date   FUNCTIONAL ENDOSCOPIC SINUS SURGERY  1981   GANGLION CYST EXCISION Right 1975   wrist   TONSILLECTOMY AND ADENOIDECTOMY  1966    Current Medications: Outpatient Medications Prior to Visit  Medication Sig Dispense Refill   ALPRAZolam (XANAX) 0.5 MG tablet TAKE 1 TABLET BY MOUTH 3 TIMES DAILY AS NEEDED. 30 tablet 0   Evolocumab (REPATHA SURECLICK) 140 MG/ML SOAJ Inject 140 mg into the skin every 14 (fourteen) days. 6 mL 3   famotidine (PEPCID) 20 MG tablet Take 1 tablet (20 mg total) by mouth daily as needed for heartburn or indigestion.     metoprolol succinate (TOPROL-XL) 25 MG 24 hr tablet TAKE 1 TABLET (25 MG TOTAL) BY MOUTH DAILY. TAKE WITH OR IMMEDIATELY FOLLOWING A MEAL. 30 tablet 3   nitroGLYCERIN (NITROSTAT) 0.4 MG SL tablet Place 1 tablet (0.4 mg total) under the tongue every 5 (five) minutes  as needed for chest pain. 25 tablet 11   SYNTHROID 112 MCG tablet TAKE 1 TABLET (112 MCG TOTAL) BY MOUTH DAILY. 30 tablet 3   valsartan (DIOVAN) 160 MG tablet Take 0.5 tablets (80 mg total) by mouth daily. Take 1/2  tablet by mouth daily 45 tablet 3   VITAMIN D, CHOLECALCIFEROL, PO Take 1 capsule by mouth daily.     zolpidem (AMBIEN) 10 MG tablet Take 5 mg by mouth at bedtime as needed for sleep.     ezetimibe (ZETIA) 10 MG tablet Take 1 tablet (10 mg total) by mouth daily. (Patient not taking: Reported on 06/22/2023) 90 tablet 3   No facility-administered medications prior to visit.     Allergies:   Other, Betadine [povidone iodine], Demerol, Fish allergy, Shrimp [shellfish allergy], and Iodine   Social History   Socioeconomic History   Marital status: Married    Spouse name: Not on file   Number of children: Not on file   Years of education: Not on file   Highest education level: Not on file  Occupational History   Not on file  Tobacco Use   Smoking status: Never   Smokeless tobacco: Never  Substance and Sexual Activity   Alcohol use: Yes    Alcohol/week: 3.0  standard drinks of alcohol    Types: 3 Glasses of wine per week    Comment: Social   Drug use: No   Sexual activity: Not Currently    Birth control/protection: Abstinence    Comment: married to [employer],  Other Topics Concern   Not on file  Social History Narrative   Not on file   Social Drivers of Health   Financial Resource Strain: Not on file  Food Insecurity: Not on file  Transportation Needs: Not on file  Physical Activity: Not on file  Stress: Not on file  Social Connections: Not on file    Social history is notable and that she is married for 42 years.  She has a 69 year old son and a 37 year old daughter who are healthy.  There is no tobacco history.  She drinks occasional Hall.  She stays home caring caring for her 4 grandchildren.  She went to [employer].  Family History:  The patient's family history includes Arthritis in her father and mother; Cerebral aneurysm in her paternal grandmother; Diabetes in her mother; Heart attack in her father; Heart disease in her father, maternal grandfather, mother, paternal grandfather, and paternal uncle; Hyperlipidemia in her father and mother; Hypertension in her father, mother, and sister; Skin cancer in her mother; Stroke in her maternal grandmother.  Both parents have CAD.  Her father is 4 and mother 49.  She has 2 sisters who have a history of hypertension and hyperlipidemia.  ROS General: Negative; No fevers, chills, or night sweats;  HEENT: Negative; No changes in vision or hearing, sinus congestion, difficulty swallowing Pulmonary: Negative; No cough, wheezing, shortness of breath, hemoptysis Cardiovascular: See HPI GI: Negative; No nausea, vomiting, diarrhea, or abdominal pain GU: Negative; No dysuria, hematuria, or difficulty voiding Musculoskeletal: Negative; no myalgias, joint pain, or weakness Hematologic/Oncology: Negative; no easy bruising, bleeding Endocrine: Negative; no heat/cold intolerance; no diabetes Neuro: Negative; no  changes in balance, headaches Skin: Negative; No rashes or skin lesions Psychiatric: Negative; No behavioral problems, depression Sleep: Negative; No snoring, daytime sleepiness, hypersomnolence, bruxism, restless legs, hypnogognic hallucinations, no cataplexy Other comprehensive 14 point system review is negative.   PHYSICAL EXAM:   VS:  BP (!) 132/9  Pulse 75   Ht 5\' 2"  (1.575 m)   Wt 157 lb (71.2 kg)   SpO2 97%   BMI 28.72 kg/m     Repeat blood pressure by me was 128/76  Wt Readings from Last 3 Encounters:  06/22/23 157 lb (71.2 kg)  12/21/22 158 lb 12.8 oz (72 kg)  07/25/22 159 lb 3.2 oz (72.2 kg)     General: Alert, oriented, no distress.  Skin: normal turgor, no rashes, warm and dry HEENT: Normocephalic, atraumatic. Pupils equal round and reactive to light; sclera anicteric; extraocular muscles intact;  Nose without nasal septal hypertrophy Mouth/Parynx benign; Mallinpatti scale 3 Neck: No JVD, no carotid bruits; normal carotid upstroke Lungs: clear to ausculatation and percussion; no wheezing or rales Chest wall: without tenderness to palpitation Heart: PMI not displaced, RRR, s1 s2 normal, 1/6 systolic murmur, no diastolic murmur, no rubs, gallops, thrills, or heaves Abdomen: soft, nontender; no hepatosplenomehaly, BS+; abdominal aorta nontender and not dilated by palpation. Back: no CVA tenderness Pulses 2+ Musculoskeletal: full range of motion, normal strength, no joint deformities Extremities: no clubbing cyanosis or edema, Homan's sign negative  Neurologic: grossly nonfocal; Cranial nerves grossly wnl Psychologic: Normal mood and affect    Studies/Labs Reviewed:   EKG Interpretation Date/Time:  Thursday June 22 2023 09:15:27 EDT Ventricular Rate:  75 PR Interval:  148 QRS Duration:  70 QT Interval:  360 QTC Calculation: 402 R Axis:   13  Text Interpretation: Normal sinus rhythm Normal ECG When compared with ECG of 21-Dec-2022 08:24, No significant  change was found Confirmed by Nicki Guadalajara ([id number]) on 06/26/2023 3:20:46 PM    December 21, 2022.  ECG (independently read by me): Normal sinus rhythm at 68 bpm  Jul 25, 2022 ECG (independently read by me): NSR at 75, normal intervals  April 13, 2022 ECG (independently read by me): NSR at 73, no ectopy, normal intervals  February 16, 2022 ECG (independently read by me): NSR at 79, no ectopy  Recent Labs:    Latest Ref Rng & Units 06/19/2023    9:51 AM 12/16/2022    9:13 AM 08/12/2022   11:45 AM  BMP  Glucose 70 - 99 mg/dL 92  88  91   BUN 8 - 27 mg/dL 16  17  12    Creatinine 0.57 - 1.00 mg/dL 6.04  5.40  9.81   BUN/Creat Ratio 12 - 28 19  19  15    Sodium 134 - 144 mmol/L 141  141  141   Potassium 3.5 - 5.2 mmol/L 5.3  5.1  5.1   Chloride 96 - 106 mmol/L 104  103  104   CO2 20 - 29 mmol/L 24  25  25    Calcium 8.7 - 10.3 mg/dL 9.5  9.6  9.6         Latest Ref Rng & Units 06/19/2023    9:51 AM 12/16/2022    9:13 AM 08/12/2022   11:45 AM  Hepatic Function  Total Protein 6.0 - 8.5 g/dL 6.4  6.7  6.4   Albumin 3.9 - 4.9 g/dL 4.4  4.5  4.3   AST 0 - 40 IU/L 22  25  23    ALT 0 - 32 IU/L 23  34  33   Alk Phosphatase 44 - 121 IU/L 89  91  105   Total Bilirubin 0.0 - 1.2 mg/dL 0.4  0.4  <1.9        Latest Ref Rng & Units 06/19/2023    9:51  AM 12/16/2022    9:13 AM 04/11/2022    8:37 AM  CBC  WBC 3.4 - 10.8 x10E3/uL 6.0  6.3  5.7   Hemoglobin 11.1 - 15.9 g/dL 66.4  40.3  47.4   Hematocrit 34.0 - 46.6 % 42.7  43.9  41.1   Platelets 150 - 450 x10E3/uL 278  341  283    Lab Results  Component Value Date   MCV 92 06/19/2023   MCV 90 12/16/2022   MCV 89 04/11/2022   Lab Results  Component Value Date   TSH 2.080 06/19/2023   Lab Results  Component Value Date   HGBA1C 5.8 (H) 12/21/2022     BNP No results found for: "BNP"  ProBNP No results found for: "PROBNP"   Lipid Panel     Component Value Date/Time   CHOL 121 06/19/2023 0951   TRIG 74 06/19/2023 0951   HDL 59  06/19/2023 0951   CHOLHDL 2.1 06/19/2023 0951   CHOLHDL 4.0 02/03/2021 0806   VLDL 13 07/08/2016 0841   LDLCALC 47 06/19/2023 0951   LDLCALC 143 (H) 02/03/2021 0806   LABVLDL 15 06/19/2023 0951     RADIOLOGY: No results found.   Additional studies/ records that were reviewed today include:   I reviewed the records of Dr. Tanya Nones and  Deboraha Sprang.  CTA: 03/02/2022 FINDINGS: Non-cardiac: See separate report from [facility]. No significant findings on limited lung and soft tissue windows.   Calcium Score: Calcium noted in LM/LAD   Coronary Arteries: Right dominant with no anomalies   LM: 1-24% calcified plaque   LAD: 50-69% % calcified plaque at take off of D2   D1: Normal   D2: Normal   Circumflex: Normal   OM1: Normal   OM2: Normal   RCA: Normal   PDA: Normal   PLA:  Normal   IMPRESSION: 1. Calcium score 144 which is 89 th percentile for age/sex   2. CAD RADS 3 calcific plaque in proximal / mid LAD Study sent for FFR   3.  Normal ascending thoracic aorta 3.3 cm  ADDENDUM REPORT: 03/02/2022 11:00   ADDENDUM: OVER-READ INTERPRETATION  CT CHEST   The following report is an over-read performed by radiologist Dr. Lovie Chol [facility] on 03/02/2022. This over-read does not include interpretation of cardiac or coronary anatomy or pathology. The coronary calcium and coronary CT angiography interpretation by the cardiologist is attached. Imaging of the chest is focused on cardiac structures and excludes much of the chest on CT.   COMPARISON:   None available   FINDINGS:   Cardiovascular: See dedicated report for cardiovascular details.   Mediastinum/Nodes: No adenopathy or acute process in the mediastinum.   Lungs/Pleura: Lungs are clear airways are patent to the extent evaluated.   Upper Abdomen: Incidental imaging of upper abdominal contents with moderately large size hiatal hernia likely mixed type hernia.    Musculoskeletal: No acute or destructive bone process.   CHEST CT overread: IMPRESSION: 1. No acute findings in the chest. 2. Incidental imaging of upper abdominal contents with moderately large size hiatal hernia likely mixed type hernia.    ECHO: 03/11/2022 1. Left ventricular ejection fraction, by estimation, is 60 to 65%. The  left ventricle has normal function. The left ventricle has no regional  wall motion abnormalities. Left ventricular diastolic parameters were  normal. The average left ventricular  global longitudinal strain is -28.3 %. The global longitudinal strain is  normal.   2. Right ventricular systolic function  is normal. The right ventricular  size is normal.   3. The mitral valve is normal in structure. Mild mitral valve  regurgitation. No evidence of mitral stenosis.   4. The aortic valve is tricuspid. Aortic valve regurgitation is not  visualized. Aortic valve sclerosis/calcification is present, without any  evidence of aortic stenosis.   5. The inferior vena cava is normal in size with greater than 50%  respiratory variability, suggesting right atrial pressure of 3 mmHg.    ASSESSMENT:    1. Agatston coronary artery calcium score between 100 and 199   2. Hyperlipidemia with target low density lipoprotein (LDL) cholesterol less than 50 mg/dL   3. Elevated Lp(a): 233.1   4. Family history of heart disease   5. Hypothyroidism, unspecified type     PLAN:  Ms. Dalaina Tates is a very pleasant 63 year old female who is followed by Dr. Tanya Nones for primary care and sees Dr. Garrison [facility] of GYN.  She has a history of hypothyroidism and has been on thyroid replacement since 2009.  In October 2023 she became aware of some blood pressure lability.  She was started on valsartan and also was started on rosuvastatin 5 mg.  Subsequent lab work on January 31, 2022 showed total cholesterol 191 HDL 56 triglycerides 107 LDL 117 and non-HDL 136.  She has experienced shortness  of breath with activity and at times has also experienced some vague chest tightness and heaviness.  When I saw her for my initial evaluation on February 16, 2022, blood pressure was elevated.  At that time I initiated low-dose metoprolol succinate at 12.5 mg. A echo Doppler study  confirmed normal LV systolic and diastolic function with mild MR, mild aortic valve sclerosis without stenosis.  Coronary CTA demonstrated a calcium score of 144 representing 89th percentile mild plaque in the left main and moderate in the LAD in the region of the second diagonal takeoff.  FFR analysis was not significant.  At her initial visit I recommended titration of rosuvastatin to 10 mg.  Subsequent laboratory has shown some improvement but LDL cholesterol was 93 and  LP(a)  was significantly elevated at 278.1.   I added Zetia 10 mg to an increase rosuvastatin to 20 mg with plan for target LDL less than 50.  I further titrated metoprolol to 25 mg.  Subsequent laboratory on Jul 20, 2022 showed mprovement in lipid studies with LDL cholesterol at 70.  However, ALT was significantly increased at 123 which may or may not be due to the increase rosuvastatin and possibly due to recent infection and antibiotic treatment.  She was advised at that time to discontinue rosuvastatin.  With her significant family history and marked elevation of LP(a) I recommended initiation of Repatha.  She has been on therapy for several months.  LDL cholesterol is now excellent at 47.  She apparently also has stopped her Zetia and has not taken this for several months.  As long as her LDL remains under 50 we will continue with just Repatha alone however if LDL increases in the future Zetia will be reinstituted.  Most recent laboratory from June 19, 2023 shows total cholesterol 121, triglycerides 74, HDL 59, and LDL cholesterol remained at 47.  Her blood pressure today is controlled on valsartan 80 mg and metoprolol succinate 25 mg daily.  She continues to be  on levothyroxine 112 micrograms for hypothyroidism.  She is aware of my upcoming retirement.  She is very appreciative of the care at that I have  given to many of her family members including her parents, aunts and uncles who had cared for for many years.  Dr. Royann Shivers sees her mother.  I will transition her to the care of Dr. Royann Shivers tomorrow for follow-up Cardiologic evaluation.   Medication Adjustments/Labs and Tests Ordered: Current medicines are reviewed at length with the patient today.  Concerns regarding medicines are outlined above.  Medication changes, Labs and Tests ordered today are listed in the Patient Instructions below. Patient Instructions  Medication Instructions:  No medication changes were made during today's visit.  *If you need a refill on your cardiac medications before your next appointment, please call your pharmacy*   Lab Work: No labs were ordered during today's visit.  If you have labs (blood work) drawn today and your tests are completely normal, you will receive your results only by: MyChart Message (if you have MyChart) OR A paper copy in the mail If you have any lab test that is abnormal or we need to change your treatment, we will call you to review the results.   Testing/Procedures: No procedures were ordered during today's visit.    Follow-Up: At [facility], you and your health needs are our priority.  As part of our continuing mission to provide you with exceptional heart care, we have created designated Provider Care Teams.  These Care Teams include your primary Cardiologist (physician) and Advanced Practice Providers (APPs -  Physician Assistants and Nurse Practitioners) who all work together to provide you with the care you need, when you need it.  We recommend signing up for the patient portal called "MyChart".  Sign up information is provided on this After Visit Summary.  MyChart is used to connect with patients for Virtual Visits  (Telemedicine).  Patients are able to view lab/test results, encounter notes, upcoming appointments, etc.  Non-urgent messages can be sent to your provider as well.   To learn more about what you can do with MyChart, go to [email or web address].    Your next appointment:   6 month(s)  Provider:   Dr. Thurmon Fair     Other Instructions [facility]  [address] [place], [place] [address] OPEN[address]IL [phone]       1st Floor: - Lobby - Registration  - Pharmacy  - Lab - Cafe   2nd Floor: - PV Lab - Diagnostic Testing (echo, CT, nuclear med)   3rd Floor: - Vacant   4th Floor: - TCTS (cardiothoracic surgery) - AFib Clinic - Structural Heart Clinic - Vascular Surgery  - Vascular Ultrasound   5th Floor: - HeartCare Cardiology (general and EP) - Clinical Pharmacy for coumadin, hypertension, lipid, weight-loss medications, and med management appointments      Valet parking services will be available as well.           Signed, Nicki Guadalajara, MD  06/26/2023 3:27 PM    [facility] [address], [address], [place], [place]  [address] Phone: [phone]

## 2023-06-26 ENCOUNTER — Encounter: Payer: Self-pay | Admitting: Cardiovascular Disease

## 2023-07-11 DIAGNOSIS — Z1231 Encounter for screening mammogram for malignant neoplasm of breast: Secondary | ICD-10-CM | POA: Diagnosis not present

## 2023-07-11 LAB — HM MAMMOGRAPHY

## 2023-08-03 DIAGNOSIS — R030 Elevated blood-pressure reading, without diagnosis of hypertension: Secondary | ICD-10-CM | POA: Diagnosis not present

## 2023-08-16 ENCOUNTER — Other Ambulatory Visit: Payer: Self-pay | Admitting: Cardiovascular Disease

## 2023-08-16 DIAGNOSIS — I10 Essential (primary) hypertension: Secondary | ICD-10-CM

## 2023-08-16 DIAGNOSIS — R030 Elevated blood-pressure reading, without diagnosis of hypertension: Secondary | ICD-10-CM | POA: Diagnosis not present

## 2023-08-31 ENCOUNTER — Telehealth: Payer: Self-pay | Admitting: Pharmacy Technician

## 2023-08-31 NOTE — Telephone Encounter (Signed)
 Pharmacy Patient Advocate Encounter   Received notification from Fax that prior authorization for Repatha is required/requested.   Insurance verification completed.   The patient is insured through [facility] .   Per test claim: PA required; PA submitted to above mentioned insurance via Fax Key/confirmation #/EOC faxed Status is pending

## 2023-09-12 ENCOUNTER — Other Ambulatory Visit (HOSPITAL_COMMUNITY): Payer: Self-pay

## 2023-09-12 NOTE — Telephone Encounter (Signed)
 Faxed more information as requested in fax

## 2023-09-13 ENCOUNTER — Other Ambulatory Visit (HOSPITAL_COMMUNITY): Payer: Self-pay

## 2023-09-13 NOTE — Telephone Encounter (Signed)
 Pharmacy Patient Advocate Encounter  Received notification from [facility] that Prior Authorization for Repatha  has been APPROVED from 09/12/23 to 09/11/24. Unable to obtain price due to refill too soon rejection, last fill date 08/23/23 next available fill date06/26/25   PA #/Case ID/Reference #: [id number]

## 2023-10-02 ENCOUNTER — Other Ambulatory Visit: Payer: Self-pay | Admitting: Cardiovascular Disease

## 2023-11-02 ENCOUNTER — Telehealth: Payer: Self-pay | Admitting: Cardiovascular Disease

## 2023-11-02 DIAGNOSIS — R7989 Other specified abnormal findings of blood chemistry: Secondary | ICD-10-CM

## 2023-11-02 DIAGNOSIS — E785 Hyperlipidemia, unspecified: Secondary | ICD-10-CM

## 2023-11-02 DIAGNOSIS — Z79899 Other long term (current) drug therapy: Secondary | ICD-10-CM

## 2023-11-02 NOTE — Telephone Encounter (Signed)
 Patient wants orders for lab work prior to 11/14 visit.

## 2023-11-02 NOTE — Telephone Encounter (Signed)
 Please check CMET and lipid profile before the appt.

## 2023-11-02 NOTE — Telephone Encounter (Signed)
 Left message asking which labs she would like ordered before her appt 11/14. I will also ask Dr Francyne.

## 2023-11-03 NOTE — Telephone Encounter (Signed)
 Labs ordered and released. MyChart message sent to patient

## 2023-11-16 DIAGNOSIS — Z124 Encounter for screening for malignant neoplasm of cervix: Secondary | ICD-10-CM | POA: Diagnosis not present

## 2023-11-16 DIAGNOSIS — Z6830 Body mass index (BMI) 30.0-30.9, adult: Secondary | ICD-10-CM | POA: Diagnosis not present

## 2023-11-16 DIAGNOSIS — Z1211 Encounter for screening for malignant neoplasm of colon: Secondary | ICD-10-CM | POA: Diagnosis not present

## 2023-11-16 DIAGNOSIS — Z1151 Encounter for screening for human papillomavirus (HPV): Secondary | ICD-10-CM | POA: Diagnosis not present

## 2023-11-16 DIAGNOSIS — Z01419 Encounter for gynecological examination (general) (routine) without abnormal findings: Secondary | ICD-10-CM | POA: Diagnosis not present

## 2023-11-16 DIAGNOSIS — L290 Pruritus ani: Secondary | ICD-10-CM | POA: Diagnosis not present

## 2023-11-16 DIAGNOSIS — F419 Anxiety disorder, unspecified: Secondary | ICD-10-CM | POA: Diagnosis not present

## 2023-11-30 DIAGNOSIS — N904 Leukoplakia of vulva: Secondary | ICD-10-CM | POA: Diagnosis not present

## 2024-01-09 DIAGNOSIS — E663 Overweight: Secondary | ICD-10-CM | POA: Diagnosis not present

## 2024-01-09 DIAGNOSIS — Z713 Dietary counseling and surveillance: Secondary | ICD-10-CM | POA: Diagnosis not present

## 2024-01-09 DIAGNOSIS — Z6830 Body mass index (BMI) 30.0-30.9, adult: Secondary | ICD-10-CM | POA: Diagnosis not present

## 2024-01-29 LAB — LIPID PANEL

## 2024-01-30 ENCOUNTER — Ambulatory Visit: Payer: Self-pay | Admitting: Cardiovascular Disease

## 2024-01-30 LAB — LIPID PANEL
Cholesterol, Total: 116 mg/dL (ref 100–199)
HDL: 48 mg/dL (ref >39)
LDL CALC COMMENT:: 2.4 ratio (ref 0.0–4.4)
LDL Chol Calc (NIH): 50 mg/dL (ref 0–99)
Triglycerides: 95 mg/dL (ref 0–149)
VLDL Cholesterol Cal: 18 mg/dL (ref 5–40)

## 2024-01-30 LAB — COMPREHENSIVE METABOLIC PANEL WITH GFR
ALT: 35 IU/L — ABNORMAL HIGH (ref 0–32)
AST: 28 IU/L (ref 0–40)
Albumin: 4.4 g/dL (ref 3.9–4.9)
Alkaline Phosphatase: 92 IU/L (ref 49–135)
BUN/Creatinine Ratio: 20 (ref 12–28)
BUN: 17 mg/dL (ref 8–27)
Bilirubin Total: 0.4 mg/dL (ref 0.0–1.2)
CO2: 23 mmol/L (ref 20–29)
Calcium: 9.6 mg/dL (ref 8.7–10.3)
Chloride: 105 mmol/L (ref 96–106)
Creatinine, Ser: 0.84 mg/dL (ref 0.57–1.00)
Globulin, Total: 2 g/dL (ref 1.5–4.5)
Glucose: 92 mg/dL (ref 70–99)
Potassium: 4.8 mmol/L (ref 3.5–5.2)
Sodium: 143 mmol/L (ref 134–144)
Total Protein: 6.4 g/dL (ref 6.0–8.5)
eGFR: 78 mL/min/1.73 (ref >59)

## 2024-02-02 ENCOUNTER — Ambulatory Visit: Attending: Cardiovascular Disease | Admitting: Cardiovascular Disease

## 2024-02-02 ENCOUNTER — Encounter: Payer: Self-pay | Admitting: Cardiovascular Disease

## 2024-02-02 VITALS — BP 124/80 | HR 70 | Ht 62.0 in | Wt 165.3 lb

## 2024-02-02 DIAGNOSIS — I2510 Atherosclerotic heart disease of native coronary artery without angina pectoris: Secondary | ICD-10-CM

## 2024-02-02 DIAGNOSIS — E039 Hypothyroidism, unspecified: Secondary | ICD-10-CM

## 2024-02-02 DIAGNOSIS — E7841 Elevated Lipoprotein(a): Secondary | ICD-10-CM | POA: Diagnosis not present

## 2024-02-02 DIAGNOSIS — I10 Essential (primary) hypertension: Secondary | ICD-10-CM

## 2024-02-02 DIAGNOSIS — R931 Abnormal findings on diagnostic imaging of heart and coronary circulation: Secondary | ICD-10-CM

## 2024-02-02 DIAGNOSIS — E7800 Pure hypercholesterolemia, unspecified: Secondary | ICD-10-CM

## 2024-02-02 DIAGNOSIS — R7303 Prediabetes: Secondary | ICD-10-CM | POA: Diagnosis not present

## 2024-02-02 MED ORDER — NITROGLYCERIN 0.4 MG SL SUBL
0.4000 mg | SUBLINGUAL_TABLET | SUBLINGUAL | 3 refills | Status: AC | PRN
Start: 2024-02-02 — End: ?

## 2024-02-02 MED ORDER — METOPROLOL SUCCINATE ER 25 MG PO TB24: 12.5000 mg | ORAL_TABLET | Freq: Every day | ORAL | 3 refills | Status: AC

## 2024-02-02 NOTE — Patient Instructions (Signed)
 Medication Instructions:  Decrease Metoprolol  Succinate to 12.5 mg daily *If you need a refill on your cardiac medications before your next appointment, please call your pharmacy*  Lab Work: None ordered If you have labs (blood work) drawn today and your tests are completely normal, you will receive your results only by: MyChart Message (if you have MyChart) OR A paper copy in the mail If you have any lab test that is abnormal or we need to change your treatment, we will call you to review the results.  Testing/Procedures: None ordered  Follow-Up: At [facility], you and your health needs are our priority.  As part of our continuing mission to provide you with exceptional heart care, our providers are all part of one team.  This team includes your primary Cardiologist (physician) and Advanced Practice Providers or APPs (Physician Assistants and Nurse Practitioners) who all work together to provide you with the care you need, when you need it.  Your next appointment:   1 year(s)  Provider:   Dr Francyne  We recommend signing up for the patient portal called MyChart.  Sign up information is provided on this After Visit Summary.  MyChart is used to connect with patients for Virtual Visits (Telemedicine).  Patients are able to view lab/test results, encounter notes, upcoming appointments, etc.  Non-urgent messages can be sent to your provider as well.   To learn more about what you can do with MyChart, go to [email or web address].

## 2024-02-02 NOTE — Progress Notes (Signed)
 Cardiology Office Note:    Date:  02/02/2024   ID:  Meghan Lozano, DOB Aug 15, 1960, MRN [id number]  PCP:  [name], MD   [facility] Providers Cardiologist:  Jerel Balding, MD     Referring MD: [name], MD   Chief Complaint  Patient presents with   Coronary Artery Disease  Transition cardiology care from Dr. Burnard  History of Present Illness:    Meghan Lozano is a 63 y.o. female with a hx of nonobstructive coronary artery disease, hypothyroidism, hypertension, hypercholesterolemia and elevated LP(a),, here to transition cardiology care from Dr. Debby Burnard.  Her mother and father are also my patients Meghan Lozano and Meghan Lozano).  She has no cardiovascular complaints.  She has no problems with shortness of breath or chest pain at rest or with activity and denies palpitations, dizziness or syncope.  Her big complaint is about unexplained rapid weight gain.  She is very frustrated by this.  She correlates the onset of weight gain with starting treatment with metoprolol  succinate.  She did decide to discontinue her levothyroxine  medication since she thought she might be dealing with an autoimmune disorder, and her recent TSH was severely elevated at 55.  Thankfully she does not have a lot of symptoms of hypothyroidism at this time.  In 2023 she underwent a coronary CT angiogram which showed a markedly elevated calcium  score at 144 representing 89th percentile for her age group.  She did have a 50 to 69% moderate stenosis in the LAD artery, but no severe stenoses.  FFR was normal in the LAD and all other territories.  She had a normal echocardiogram in 2023 with exception of mild aortic valve sclerosis.  EF was 60 to 65%, GLS was -28.3%, diastolic parameters were normal.  She reports a history of a negative sleep study.  She has a markedly elevated LP(a) at 278, but also has had a surprisingly successful response to treatment with Repatha .  Metabolic control is  pretty good with a hemoglobin A1c of 6.0% without medications specifically for diabetes mellitus.  LDL is 50 (143 before treatment) and HDL is 48, triglycerides are normal.  She has normal renal function.  Both her parents have coronary artery disease.  Her mother also has advanced valvular heart disease and permanent atrial fibrillation and a pacemaker for complete heart block.  Past Medical History:  Diagnosis Date   Anxiety    [employer] 07/18/2020   Arthritis    bilateral hands and neck   Elevated LFTs    Hyperlipidemia    Hypertension 12/2022   Hypothyroidism    Hypothyroidism    Thyroid  disease    [employer] 07/18/2020    Past Surgical History:  Procedure Laterality Date   FUNCTIONAL ENDOSCOPIC SINUS SURGERY  1981   GANGLION CYST EXCISION Right 1975   wrist   TONSILLECTOMY AND ADENOIDECTOMY  1966    Current Medications: Current Meds  Medication Sig   ALPRAZolam  (XANAX ) 0.5 MG tablet TAKE 1 TABLET BY MOUTH 3 TIMES DAILY AS NEEDED.   buPROPion  (WELLBUTRIN  XL) 150 MG 24 hr tablet Take 150 mg by mouth daily.   clobetasol  ointment (TEMOVATE ) 0.05 % Apply 1 Application topically as needed.   naltrexone (DEPADE) 50 MG tablet Take 50 mg by mouth daily.   REPATHA  SURECLICK 140 MG/ML SOAJ INJECT 140 MG INTO THE SKIN EVERY 14 DAYS.   SYNTHROID  112 MCG tablet TAKE 1 TABLET (112 MCG TOTAL) BY MOUTH DAILY.   valsartan  (DIOVAN ) 160 MG  tablet Take 0.5 tablets (80 mg total) by mouth daily. Take 1/2  tablet by mouth daily   VITAMIN D , CHOLECALCIFEROL, PO Take 1 capsule by mouth daily.   zolpidem  (AMBIEN ) 10 MG tablet Take 5 mg by mouth at bedtime as needed for sleep.   [DISCONTINUED] metoprolol  succinate (TOPROL -XL) 25 MG 24 hr tablet TAKE 1 TABLET (25 MG TOTAL) BY MOUTH DAILY. TAKE WITH OR IMMEDIATELY FOLLOWING A MEAL.     Allergies:   Other, Betadine [povidone iodine], Demerol, Fish allergy, Meperidine, Povidone-iodine, Shellfish protein-containing drug products, Shrimp [shellfish  allergy], Sulfa  antibiotics, and Iodine   Social History   Socioeconomic History   Marital status: Married    Spouse name: Not on file   Number of children: Not on file   Years of education: Not on file   Highest education level: Not on file  Occupational History   Not on file  Tobacco Use   Smoking status: Never   Smokeless tobacco: Never  Substance and Sexual Activity   Alcohol use: Yes    Alcohol/week: 3.0 standard drinks of alcohol    Types: 3 Glasses of wine per week    Comment: Social   Drug use: No   Sexual activity: Not Currently    Birth control/protection: Abstinence    Comment: married to [employer],  Other Topics Concern   Not on file  Social History Narrative   Not on file   Social Drivers of Health   Financial Resource Strain: Not on file  Food Insecurity: Not on file  Transportation Needs: Not on file  Physical Activity: Not on file  Stress: Not on file  Social Connections: Not on file     Family History: The patient's family history includes Arthritis in her father and mother; Cerebral aneurysm in her paternal grandmother; Diabetes in her mother; Heart attack in her father; Heart disease in her father, maternal grandfather, mother, paternal grandfather, and paternal uncle; Hyperlipidemia in her father and mother; Hypertension in her father, mother, and sister; Skin cancer in her mother; Stroke in her maternal grandmother.  ROS:   Please see the history of present illness.     All other systems reviewed and are negative.  EKGs/Labs/Other Studies Reviewed:    The following studies were reviewed today:  EKG Interpretation Date/Time:  Friday February 02 2024 09:13:59 EST Ventricular Rate:  70 PR Interval:  140 QRS Duration:  78 QT Interval:  368 QTC Calculation: 397 R Axis:   2  Text Interpretation: Normal sinus rhythm Normal ECG When compared with ECG of 22-Jun-2023 09:15, No significant change was found Confirmed by Mahi Zabriskie ([id number]) on  02/02/2024 9:23:48 AM    Recent Labs: 06/19/2023: Hemoglobin 13.9; Platelets 278; TSH 2.080 01/29/2024: ALT 35; BUN 17; Creatinine, Ser 0.84; Potassium 4.8; Sodium 143  Recent Lipid Panel    Component Value Date/Time   CHOL 116 01/29/2024 1031   TRIG 95 01/29/2024 1031   HDL 48 01/29/2024 1031   CHOLHDL 2.4 01/29/2024 1031   CHOLHDL 4.0 02/03/2021 0806   VLDL 13 07/08/2016 0841   LDLCALC 50 01/29/2024 1031   LDLCALC 143 (H) 02/03/2021 0806     Risk Assessment/Calculations:                Physical Exam:    VS:  BP 124/80 (BP Location: Left Arm, Patient Position: Sitting, Cuff Size: Normal)   Pulse 70   Ht 5' 2 (1.575 m)   Wt 165 lb 4.8 oz (75 kg)  SpO2 97%   BMI 30.23 kg/m     Wt Readings from Last 3 Encounters:  02/02/24 165 lb 4.8 oz (75 kg)  06/22/23 157 lb (71.2 kg)  12/21/22 158 lb 12.8 oz (72 kg)     GEN:  Well nourished, well developed in no acute distress.  Borderline obese HEENT: Normal NECK: No JVD; No carotid bruits LYMPHATICS: No lymphadenopathy CARDIAC: RRR, 1/6 aortic ejection murmur, no diastolic murmurs, rubs, gallops RESPIRATORY:  Clear to auscultation without rales, wheezing or rhonchi  ABDOMEN: Soft, non-tender, non-distended MUSCULOSKELETAL:  No edema; No deformity  SKIN: Warm and dry NEUROLOGIC:  Alert and oriented x 3 PSYCHIATRIC:  Normal affect   ASSESSMENT:    1. Coronary artery disease involving native coronary artery of native heart without angina pectoris   2. Essential hypertension   3. Elevated coronary artery calcium  score   4. Pure hypercholesterolemia   5. Elevated Lp(a)   6. Acquired hypothyroidism   7. Prediabetes    PLAN:    In order of problems listed above:  CAD: Moderate stenosis in the proximal LAD artery and markedly elevated coronary calcium  score for age.  Asymptomatic.  No indication for invasive coronary evaluation, aggressively lower risk factors.  Discussed healthy diet and exercise.   HLP: All lipid  parameters are currently in target range.  Her LDL cholesterol had an excellent response to treatment with Repatha  despite her elevated LP(a).   HTN: Blood pressure is very well-controlled on the current medications.  Reduce metoprolol  succinate to 12.5 mg daily.  Consider stopping it altogether in the future. Hypothyroidism: She has restarted levothyroxine  supplementation.  It is too soon to recheck her TSH.  We discussed the fact that this could be an important contribution to her weight gain. PreDM: Discussed the fact that glucose tolerance will worsen with hypothyroidism.           Medication Adjustments/Labs and Tests Ordered: Current medicines are reviewed at length with the patient today.  Concerns regarding medicines are outlined above.  Orders Placed This Encounter  Procedures   EKG 12-Lead   Meds ordered this encounter  Medications   metoprolol  succinate (TOPROL -XL) 25 MG 24 hr tablet    Sig: Take 0.5 tablets (12.5 mg total) by mouth daily. Take with or immediately following a meal.    Dispense:  45 tablet    Refill:  3   nitroGLYCERIN  (NITROSTAT ) 0.4 MG SL tablet    Sig: Place 1 tablet (0.4 mg total) under the tongue every 5 (five) minutes as needed for chest pain.    Dispense:  25 tablet    Refill:  3    Patient Instructions  Medication Instructions:  Decrease Metoprolol  Succinate to 12.5 mg daily *If you need a refill on your cardiac medications before your next appointment, please call your pharmacy*  Lab Work: None ordered If you have labs (blood work) drawn today and your tests are completely normal, you will receive your results only by: MyChart Message (if you have MyChart) OR A paper copy in the mail If you have any lab test that is abnormal or we need to change your treatment, we will call you to review the results.  Testing/Procedures: None ordered  Follow-Up: At [facility], you and your health needs are our priority.  As part of our  continuing mission to provide you with exceptional heart care, our providers are all part of one team.  This team includes your primary Cardiologist (physician) and Advanced Practice Providers  or APPs (Physician Assistants and Nurse Practitioners) who all work together to provide you with the care you need, when you need it.  Your next appointment:   1 year(s)  Provider:   Dr Francyne  We recommend signing up for the patient portal called MyChart.  Sign up information is provided on this After Visit Summary.  MyChart is used to connect with patients for Virtual Visits (Telemedicine).  Patients are able to view lab/test results, encounter notes, upcoming appointments, etc.  Non-urgent messages can be sent to your provider as well.   To learn more about what you can do with MyChart, go to [email or web address].      Signed, Jerel Francyne, MD  02/02/2024 6:40 PM    [facility]

## 2024-02-12 ENCOUNTER — Other Ambulatory Visit: Payer: Self-pay | Admitting: Cardiovascular Disease

## 2024-02-12 ENCOUNTER — Other Ambulatory Visit: Payer: Self-pay

## 2024-03-12 DIAGNOSIS — E039 Hypothyroidism, unspecified: Secondary | ICD-10-CM | POA: Diagnosis not present

## 2024-03-12 DIAGNOSIS — R7303 Prediabetes: Secondary | ICD-10-CM | POA: Diagnosis not present

## 2024-03-12 DIAGNOSIS — E538 Deficiency of other specified B group vitamins: Secondary | ICD-10-CM | POA: Diagnosis not present

## 2024-03-12 DIAGNOSIS — R030 Elevated blood-pressure reading, without diagnosis of hypertension: Secondary | ICD-10-CM | POA: Diagnosis not present

## 2024-03-12 DIAGNOSIS — E7800 Pure hypercholesterolemia, unspecified: Secondary | ICD-10-CM | POA: Diagnosis not present

## 2024-03-12 DIAGNOSIS — I2510 Atherosclerotic heart disease of native coronary artery without angina pectoris: Secondary | ICD-10-CM | POA: Diagnosis not present

## 2024-03-12 DIAGNOSIS — E559 Vitamin D deficiency, unspecified: Secondary | ICD-10-CM | POA: Diagnosis not present
